# Patient Record
Sex: Female | Born: 1950 | Hispanic: No | Smoking: Former smoker
Health system: Southern US, Community
[De-identification: ages and names within clinical notes are randomized; demographics above are authoritative.]

## PROBLEM LIST (undated history)

## (undated) DIAGNOSIS — K219 Gastro-esophageal reflux disease without esophagitis: Secondary | ICD-10-CM

## (undated) DIAGNOSIS — I8393 Asymptomatic varicose veins of bilateral lower extremities: Secondary | ICD-10-CM

## (undated) DIAGNOSIS — J301 Allergic rhinitis due to pollen: Secondary | ICD-10-CM

## (undated) DIAGNOSIS — E785 Hyperlipidemia, unspecified: Secondary | ICD-10-CM

## (undated) DIAGNOSIS — E538 Deficiency of other specified B group vitamins: Secondary | ICD-10-CM

## (undated) DIAGNOSIS — E119 Type 2 diabetes mellitus without complications: Secondary | ICD-10-CM

## (undated) DIAGNOSIS — R200 Anesthesia of skin: Secondary | ICD-10-CM

## (undated) DIAGNOSIS — I8392 Asymptomatic varicose veins of left lower extremity: Secondary | ICD-10-CM

## (undated) DIAGNOSIS — L97919 Non-pressure chronic ulcer of unspecified part of right lower leg with unspecified severity: Secondary | ICD-10-CM

## (undated) DIAGNOSIS — I10 Essential (primary) hypertension: Secondary | ICD-10-CM

## (undated) DIAGNOSIS — R202 Paresthesia of skin: Secondary | ICD-10-CM

## (undated) DIAGNOSIS — L97929 Non-pressure chronic ulcer of unspecified part of left lower leg with unspecified severity: Secondary | ICD-10-CM

## (undated) DIAGNOSIS — G629 Polyneuropathy, unspecified: Secondary | ICD-10-CM

## (undated) DIAGNOSIS — E669 Obesity, unspecified: Secondary | ICD-10-CM

## (undated) DIAGNOSIS — I83029 Varicose veins of left lower extremity with ulcer of unspecified site: Secondary | ICD-10-CM

## (undated) DIAGNOSIS — L97909 Non-pressure chronic ulcer of unspecified part of unspecified lower leg with unspecified severity: Secondary | ICD-10-CM

## (undated) HISTORY — DX: Varicose veins of left lower extremity with ulcer of unspecified site: I83.029

## (undated) HISTORY — DX: Deficiency of other specified B group vitamins: E53.8

## (undated) HISTORY — DX: Hyperlipidemia, unspecified: E78.5

## (undated) HISTORY — DX: Non-pressure chronic ulcer of unspecified part of right lower leg with unspecified severity: L97.919

## (undated) HISTORY — DX: Polyneuropathy, unspecified: G62.9

## (undated) HISTORY — DX: Obesity, unspecified: E66.9

## (undated) HISTORY — PX: TUBAL LIGATION: SHX77

## (undated) HISTORY — PX: KNEE SURGERY: SHX244

## (undated) HISTORY — DX: Type 2 diabetes mellitus without complications: E11.9

## (undated) HISTORY — DX: Anesthesia of skin: R20.0

## (undated) HISTORY — PX: CHOLECYSTECTOMY: SHX55

## (undated) HISTORY — DX: Non-pressure chronic ulcer of unspecified part of right lower leg with unspecified severity: L97.929

## (undated) HISTORY — DX: Gastro-esophageal reflux disease without esophagitis: K21.9

## (undated) HISTORY — DX: Allergic rhinitis due to pollen: J30.1

## (undated) HISTORY — DX: Essential (primary) hypertension: I10

## (undated) HISTORY — DX: Asymptomatic varicose veins of bilateral lower extremities: I83.93

## (undated) HISTORY — DX: Anesthesia of skin: R20.2

## (undated) HISTORY — DX: Non-pressure chronic ulcer of unspecified part of unspecified lower leg with unspecified severity: L97.909

## (undated) HISTORY — DX: Asymptomatic varicose veins of left lower extremity: I83.92

---

## 2015-01-19 HISTORY — PX: CATARACT EXTRACTION: SUR2

## 2017-06-10 DIAGNOSIS — E1151 Type 2 diabetes mellitus with diabetic peripheral angiopathy without gangrene: Secondary | ICD-10-CM | POA: Diagnosis not present

## 2017-06-10 DIAGNOSIS — E1140 Type 2 diabetes mellitus with diabetic neuropathy, unspecified: Secondary | ICD-10-CM | POA: Diagnosis not present

## 2017-07-26 DIAGNOSIS — E1151 Type 2 diabetes mellitus with diabetic peripheral angiopathy without gangrene: Secondary | ICD-10-CM | POA: Diagnosis not present

## 2017-07-26 DIAGNOSIS — L89892 Pressure ulcer of other site, stage 2: Secondary | ICD-10-CM | POA: Diagnosis not present

## 2017-07-26 DIAGNOSIS — E1140 Type 2 diabetes mellitus with diabetic neuropathy, unspecified: Secondary | ICD-10-CM | POA: Diagnosis not present

## 2017-08-30 DIAGNOSIS — E1151 Type 2 diabetes mellitus with diabetic peripheral angiopathy without gangrene: Secondary | ICD-10-CM | POA: Diagnosis not present

## 2017-08-30 DIAGNOSIS — E1140 Type 2 diabetes mellitus with diabetic neuropathy, unspecified: Secondary | ICD-10-CM | POA: Diagnosis not present

## 2017-10-19 DIAGNOSIS — Z6841 Body Mass Index (BMI) 40.0 and over, adult: Secondary | ICD-10-CM | POA: Diagnosis not present

## 2017-10-19 DIAGNOSIS — E782 Mixed hyperlipidemia: Secondary | ICD-10-CM | POA: Diagnosis not present

## 2017-10-19 DIAGNOSIS — I8392 Asymptomatic varicose veins of left lower extremity: Secondary | ICD-10-CM | POA: Diagnosis not present

## 2017-10-19 DIAGNOSIS — I10 Essential (primary) hypertension: Secondary | ICD-10-CM | POA: Diagnosis not present

## 2017-10-19 DIAGNOSIS — E538 Deficiency of other specified B group vitamins: Secondary | ICD-10-CM | POA: Diagnosis not present

## 2017-10-19 DIAGNOSIS — E1142 Type 2 diabetes mellitus with diabetic polyneuropathy: Secondary | ICD-10-CM | POA: Diagnosis not present

## 2017-10-19 DIAGNOSIS — K219 Gastro-esophageal reflux disease without esophagitis: Secondary | ICD-10-CM | POA: Diagnosis not present

## 2017-10-27 DIAGNOSIS — Z6841 Body Mass Index (BMI) 40.0 and over, adult: Secondary | ICD-10-CM | POA: Diagnosis not present

## 2017-10-27 DIAGNOSIS — L03032 Cellulitis of left toe: Secondary | ICD-10-CM | POA: Diagnosis not present

## 2017-10-27 DIAGNOSIS — E1142 Type 2 diabetes mellitus with diabetic polyneuropathy: Secondary | ICD-10-CM | POA: Diagnosis not present

## 2017-10-27 DIAGNOSIS — E1165 Type 2 diabetes mellitus with hyperglycemia: Secondary | ICD-10-CM | POA: Diagnosis not present

## 2017-10-31 DIAGNOSIS — Z1231 Encounter for screening mammogram for malignant neoplasm of breast: Secondary | ICD-10-CM | POA: Diagnosis not present

## 2017-11-08 DIAGNOSIS — E1140 Type 2 diabetes mellitus with diabetic neuropathy, unspecified: Secondary | ICD-10-CM | POA: Diagnosis not present

## 2017-11-08 DIAGNOSIS — E1151 Type 2 diabetes mellitus with diabetic peripheral angiopathy without gangrene: Secondary | ICD-10-CM | POA: Diagnosis not present

## 2018-01-31 DIAGNOSIS — E1140 Type 2 diabetes mellitus with diabetic neuropathy, unspecified: Secondary | ICD-10-CM | POA: Diagnosis not present

## 2018-01-31 DIAGNOSIS — E1151 Type 2 diabetes mellitus with diabetic peripheral angiopathy without gangrene: Secondary | ICD-10-CM | POA: Diagnosis not present

## 2018-02-04 DIAGNOSIS — K047 Periapical abscess without sinus: Secondary | ICD-10-CM | POA: Diagnosis not present

## 2018-02-20 DIAGNOSIS — K210 Gastro-esophageal reflux disease with esophagitis: Secondary | ICD-10-CM | POA: Diagnosis not present

## 2018-02-20 DIAGNOSIS — I10 Essential (primary) hypertension: Secondary | ICD-10-CM | POA: Diagnosis not present

## 2018-02-20 DIAGNOSIS — E782 Mixed hyperlipidemia: Secondary | ICD-10-CM | POA: Diagnosis not present

## 2018-02-20 DIAGNOSIS — E1165 Type 2 diabetes mellitus with hyperglycemia: Secondary | ICD-10-CM | POA: Diagnosis not present

## 2018-02-20 DIAGNOSIS — E1142 Type 2 diabetes mellitus with diabetic polyneuropathy: Secondary | ICD-10-CM | POA: Diagnosis not present

## 2018-02-24 DIAGNOSIS — I8392 Asymptomatic varicose veins of left lower extremity: Secondary | ICD-10-CM | POA: Diagnosis not present

## 2018-02-24 DIAGNOSIS — E782 Mixed hyperlipidemia: Secondary | ICD-10-CM | POA: Diagnosis not present

## 2018-02-24 DIAGNOSIS — I10 Essential (primary) hypertension: Secondary | ICD-10-CM | POA: Diagnosis not present

## 2018-02-24 DIAGNOSIS — E1142 Type 2 diabetes mellitus with diabetic polyneuropathy: Secondary | ICD-10-CM | POA: Diagnosis not present

## 2018-02-24 DIAGNOSIS — K219 Gastro-esophageal reflux disease without esophagitis: Secondary | ICD-10-CM | POA: Diagnosis not present

## 2018-04-11 DIAGNOSIS — E1151 Type 2 diabetes mellitus with diabetic peripheral angiopathy without gangrene: Secondary | ICD-10-CM | POA: Diagnosis not present

## 2018-04-11 DIAGNOSIS — E1140 Type 2 diabetes mellitus with diabetic neuropathy, unspecified: Secondary | ICD-10-CM | POA: Diagnosis not present

## 2018-06-19 DIAGNOSIS — E1165 Type 2 diabetes mellitus with hyperglycemia: Secondary | ICD-10-CM | POA: Diagnosis not present

## 2018-06-19 DIAGNOSIS — E1143 Type 2 diabetes mellitus with diabetic autonomic (poly)neuropathy: Secondary | ICD-10-CM | POA: Diagnosis not present

## 2018-06-19 DIAGNOSIS — E1142 Type 2 diabetes mellitus with diabetic polyneuropathy: Secondary | ICD-10-CM | POA: Diagnosis not present

## 2018-06-19 DIAGNOSIS — E782 Mixed hyperlipidemia: Secondary | ICD-10-CM | POA: Diagnosis not present

## 2018-06-19 DIAGNOSIS — E039 Hypothyroidism, unspecified: Secondary | ICD-10-CM | POA: Diagnosis not present

## 2018-06-22 DIAGNOSIS — E782 Mixed hyperlipidemia: Secondary | ICD-10-CM | POA: Diagnosis not present

## 2018-06-22 DIAGNOSIS — I8392 Asymptomatic varicose veins of left lower extremity: Secondary | ICD-10-CM | POA: Diagnosis not present

## 2018-06-22 DIAGNOSIS — Z0001 Encounter for general adult medical examination with abnormal findings: Secondary | ICD-10-CM | POA: Diagnosis not present

## 2018-06-22 DIAGNOSIS — E1142 Type 2 diabetes mellitus with diabetic polyneuropathy: Secondary | ICD-10-CM | POA: Diagnosis not present

## 2018-06-22 DIAGNOSIS — J301 Allergic rhinitis due to pollen: Secondary | ICD-10-CM | POA: Diagnosis not present

## 2018-06-22 DIAGNOSIS — I10 Essential (primary) hypertension: Secondary | ICD-10-CM | POA: Diagnosis not present

## 2018-07-04 DIAGNOSIS — E1140 Type 2 diabetes mellitus with diabetic neuropathy, unspecified: Secondary | ICD-10-CM | POA: Diagnosis not present

## 2018-07-04 DIAGNOSIS — E1151 Type 2 diabetes mellitus with diabetic peripheral angiopathy without gangrene: Secondary | ICD-10-CM | POA: Diagnosis not present

## 2018-08-24 DIAGNOSIS — M810 Age-related osteoporosis without current pathological fracture: Secondary | ICD-10-CM | POA: Diagnosis not present

## 2018-08-24 DIAGNOSIS — M85851 Other specified disorders of bone density and structure, right thigh: Secondary | ICD-10-CM | POA: Diagnosis not present

## 2018-08-24 DIAGNOSIS — M8588 Other specified disorders of bone density and structure, other site: Secondary | ICD-10-CM | POA: Diagnosis not present

## 2018-10-03 DIAGNOSIS — E1151 Type 2 diabetes mellitus with diabetic peripheral angiopathy without gangrene: Secondary | ICD-10-CM | POA: Diagnosis not present

## 2018-10-03 DIAGNOSIS — E1140 Type 2 diabetes mellitus with diabetic neuropathy, unspecified: Secondary | ICD-10-CM | POA: Diagnosis not present

## 2018-10-23 DIAGNOSIS — E1142 Type 2 diabetes mellitus with diabetic polyneuropathy: Secondary | ICD-10-CM | POA: Diagnosis not present

## 2018-10-23 DIAGNOSIS — E782 Mixed hyperlipidemia: Secondary | ICD-10-CM | POA: Diagnosis not present

## 2018-10-23 DIAGNOSIS — E039 Hypothyroidism, unspecified: Secondary | ICD-10-CM | POA: Diagnosis not present

## 2018-10-23 DIAGNOSIS — E1165 Type 2 diabetes mellitus with hyperglycemia: Secondary | ICD-10-CM | POA: Diagnosis not present

## 2018-10-23 DIAGNOSIS — Z9189 Other specified personal risk factors, not elsewhere classified: Secondary | ICD-10-CM | POA: Diagnosis not present

## 2018-10-23 DIAGNOSIS — E1143 Type 2 diabetes mellitus with diabetic autonomic (poly)neuropathy: Secondary | ICD-10-CM | POA: Diagnosis not present

## 2018-10-26 DIAGNOSIS — Z23 Encounter for immunization: Secondary | ICD-10-CM | POA: Diagnosis not present

## 2018-10-26 DIAGNOSIS — K219 Gastro-esophageal reflux disease without esophagitis: Secondary | ICD-10-CM | POA: Diagnosis not present

## 2018-10-26 DIAGNOSIS — E1142 Type 2 diabetes mellitus with diabetic polyneuropathy: Secondary | ICD-10-CM | POA: Diagnosis not present

## 2018-10-26 DIAGNOSIS — I8392 Asymptomatic varicose veins of left lower extremity: Secondary | ICD-10-CM | POA: Diagnosis not present

## 2018-10-26 DIAGNOSIS — E782 Mixed hyperlipidemia: Secondary | ICD-10-CM | POA: Diagnosis not present

## 2018-10-26 DIAGNOSIS — I10 Essential (primary) hypertension: Secondary | ICD-10-CM | POA: Diagnosis not present

## 2019-01-02 DIAGNOSIS — E1140 Type 2 diabetes mellitus with diabetic neuropathy, unspecified: Secondary | ICD-10-CM | POA: Diagnosis not present

## 2019-01-02 DIAGNOSIS — E1151 Type 2 diabetes mellitus with diabetic peripheral angiopathy without gangrene: Secondary | ICD-10-CM | POA: Diagnosis not present

## 2019-02-05 DIAGNOSIS — J329 Chronic sinusitis, unspecified: Secondary | ICD-10-CM | POA: Diagnosis not present

## 2019-02-20 DIAGNOSIS — H524 Presbyopia: Secondary | ICD-10-CM | POA: Diagnosis not present

## 2019-02-20 DIAGNOSIS — H26493 Other secondary cataract, bilateral: Secondary | ICD-10-CM | POA: Diagnosis not present

## 2019-02-20 DIAGNOSIS — E119 Type 2 diabetes mellitus without complications: Secondary | ICD-10-CM | POA: Diagnosis not present

## 2019-02-22 DIAGNOSIS — K2100 Gastro-esophageal reflux disease with esophagitis, without bleeding: Secondary | ICD-10-CM | POA: Diagnosis not present

## 2019-02-22 DIAGNOSIS — N183 Chronic kidney disease, stage 3 unspecified: Secondary | ICD-10-CM | POA: Diagnosis not present

## 2019-02-22 DIAGNOSIS — R5383 Other fatigue: Secondary | ICD-10-CM | POA: Diagnosis not present

## 2019-02-22 DIAGNOSIS — E1165 Type 2 diabetes mellitus with hyperglycemia: Secondary | ICD-10-CM | POA: Diagnosis not present

## 2019-02-22 DIAGNOSIS — E782 Mixed hyperlipidemia: Secondary | ICD-10-CM | POA: Diagnosis not present

## 2019-02-22 DIAGNOSIS — I10 Essential (primary) hypertension: Secondary | ICD-10-CM | POA: Diagnosis not present

## 2019-02-28 DIAGNOSIS — I10 Essential (primary) hypertension: Secondary | ICD-10-CM | POA: Diagnosis not present

## 2019-02-28 DIAGNOSIS — K219 Gastro-esophageal reflux disease without esophagitis: Secondary | ICD-10-CM | POA: Diagnosis not present

## 2019-02-28 DIAGNOSIS — I8392 Asymptomatic varicose veins of left lower extremity: Secondary | ICD-10-CM | POA: Diagnosis not present

## 2019-02-28 DIAGNOSIS — E782 Mixed hyperlipidemia: Secondary | ICD-10-CM | POA: Diagnosis not present

## 2019-02-28 DIAGNOSIS — E1142 Type 2 diabetes mellitus with diabetic polyneuropathy: Secondary | ICD-10-CM | POA: Diagnosis not present

## 2019-04-03 DIAGNOSIS — E1151 Type 2 diabetes mellitus with diabetic peripheral angiopathy without gangrene: Secondary | ICD-10-CM | POA: Diagnosis not present

## 2019-04-03 DIAGNOSIS — E1140 Type 2 diabetes mellitus with diabetic neuropathy, unspecified: Secondary | ICD-10-CM | POA: Diagnosis not present

## 2019-06-26 DIAGNOSIS — E1140 Type 2 diabetes mellitus with diabetic neuropathy, unspecified: Secondary | ICD-10-CM | POA: Diagnosis not present

## 2019-06-26 DIAGNOSIS — E1151 Type 2 diabetes mellitus with diabetic peripheral angiopathy without gangrene: Secondary | ICD-10-CM | POA: Diagnosis not present

## 2019-07-09 DIAGNOSIS — J329 Chronic sinusitis, unspecified: Secondary | ICD-10-CM | POA: Diagnosis not present

## 2019-07-13 DIAGNOSIS — E039 Hypothyroidism, unspecified: Secondary | ICD-10-CM | POA: Diagnosis not present

## 2019-07-13 DIAGNOSIS — E1143 Type 2 diabetes mellitus with diabetic autonomic (poly)neuropathy: Secondary | ICD-10-CM | POA: Diagnosis not present

## 2019-07-13 DIAGNOSIS — Z0001 Encounter for general adult medical examination with abnormal findings: Secondary | ICD-10-CM | POA: Diagnosis not present

## 2019-07-13 DIAGNOSIS — D529 Folate deficiency anemia, unspecified: Secondary | ICD-10-CM | POA: Diagnosis not present

## 2019-07-13 DIAGNOSIS — I10 Essential (primary) hypertension: Secondary | ICD-10-CM | POA: Diagnosis not present

## 2019-07-13 DIAGNOSIS — K2100 Gastro-esophageal reflux disease with esophagitis, without bleeding: Secondary | ICD-10-CM | POA: Diagnosis not present

## 2019-07-13 DIAGNOSIS — D510 Vitamin B12 deficiency anemia due to intrinsic factor deficiency: Secondary | ICD-10-CM | POA: Diagnosis not present

## 2019-07-13 DIAGNOSIS — E782 Mixed hyperlipidemia: Secondary | ICD-10-CM | POA: Diagnosis not present

## 2019-07-13 DIAGNOSIS — D649 Anemia, unspecified: Secondary | ICD-10-CM | POA: Diagnosis not present

## 2019-07-16 DIAGNOSIS — Z0001 Encounter for general adult medical examination with abnormal findings: Secondary | ICD-10-CM | POA: Diagnosis not present

## 2019-07-16 DIAGNOSIS — Z1212 Encounter for screening for malignant neoplasm of rectum: Secondary | ICD-10-CM | POA: Diagnosis not present

## 2019-07-16 DIAGNOSIS — I10 Essential (primary) hypertension: Secondary | ICD-10-CM | POA: Diagnosis not present

## 2019-07-16 DIAGNOSIS — E1142 Type 2 diabetes mellitus with diabetic polyneuropathy: Secondary | ICD-10-CM | POA: Diagnosis not present

## 2019-07-16 DIAGNOSIS — E782 Mixed hyperlipidemia: Secondary | ICD-10-CM | POA: Diagnosis not present

## 2019-07-16 DIAGNOSIS — R1030 Lower abdominal pain, unspecified: Secondary | ICD-10-CM | POA: Diagnosis not present

## 2019-07-16 DIAGNOSIS — R300 Dysuria: Secondary | ICD-10-CM | POA: Diagnosis not present

## 2019-07-16 DIAGNOSIS — R1012 Left upper quadrant pain: Secondary | ICD-10-CM | POA: Diagnosis not present

## 2019-07-16 DIAGNOSIS — E538 Deficiency of other specified B group vitamins: Secondary | ICD-10-CM | POA: Diagnosis not present

## 2019-07-16 DIAGNOSIS — I8392 Asymptomatic varicose veins of left lower extremity: Secondary | ICD-10-CM | POA: Diagnosis not present

## 2019-07-16 DIAGNOSIS — J301 Allergic rhinitis due to pollen: Secondary | ICD-10-CM | POA: Diagnosis not present

## 2019-08-21 DIAGNOSIS — H524 Presbyopia: Secondary | ICD-10-CM | POA: Diagnosis not present

## 2019-08-21 DIAGNOSIS — H5202 Hypermetropia, left eye: Secondary | ICD-10-CM | POA: Diagnosis not present

## 2019-08-26 DIAGNOSIS — L97821 Non-pressure chronic ulcer of other part of left lower leg limited to breakdown of skin: Secondary | ICD-10-CM | POA: Diagnosis not present

## 2019-08-26 DIAGNOSIS — I482 Chronic atrial fibrillation, unspecified: Secondary | ICD-10-CM | POA: Diagnosis not present

## 2019-08-26 DIAGNOSIS — N183 Chronic kidney disease, stage 3 unspecified: Secondary | ICD-10-CM | POA: Diagnosis not present

## 2019-08-26 DIAGNOSIS — L97811 Non-pressure chronic ulcer of other part of right lower leg limited to breakdown of skin: Secondary | ICD-10-CM | POA: Diagnosis not present

## 2019-08-26 DIAGNOSIS — I129 Hypertensive chronic kidney disease with stage 1 through stage 4 chronic kidney disease, or unspecified chronic kidney disease: Secondary | ICD-10-CM | POA: Diagnosis not present

## 2019-08-26 DIAGNOSIS — E1142 Type 2 diabetes mellitus with diabetic polyneuropathy: Secondary | ICD-10-CM | POA: Diagnosis not present

## 2019-08-26 DIAGNOSIS — Z4800 Encounter for change or removal of nonsurgical wound dressing: Secondary | ICD-10-CM | POA: Diagnosis not present

## 2019-08-26 DIAGNOSIS — Z7984 Long term (current) use of oral hypoglycemic drugs: Secondary | ICD-10-CM | POA: Diagnosis not present

## 2019-08-26 DIAGNOSIS — I87313 Chronic venous hypertension (idiopathic) with ulcer of bilateral lower extremity: Secondary | ICD-10-CM | POA: Diagnosis not present

## 2019-08-26 DIAGNOSIS — E1151 Type 2 diabetes mellitus with diabetic peripheral angiopathy without gangrene: Secondary | ICD-10-CM | POA: Diagnosis not present

## 2019-08-26 DIAGNOSIS — E1122 Type 2 diabetes mellitus with diabetic chronic kidney disease: Secondary | ICD-10-CM | POA: Diagnosis not present

## 2019-08-29 DIAGNOSIS — E1142 Type 2 diabetes mellitus with diabetic polyneuropathy: Secondary | ICD-10-CM | POA: Diagnosis not present

## 2019-08-29 DIAGNOSIS — L97811 Non-pressure chronic ulcer of other part of right lower leg limited to breakdown of skin: Secondary | ICD-10-CM | POA: Diagnosis not present

## 2019-08-29 DIAGNOSIS — I87313 Chronic venous hypertension (idiopathic) with ulcer of bilateral lower extremity: Secondary | ICD-10-CM | POA: Diagnosis not present

## 2019-08-29 DIAGNOSIS — I129 Hypertensive chronic kidney disease with stage 1 through stage 4 chronic kidney disease, or unspecified chronic kidney disease: Secondary | ICD-10-CM | POA: Diagnosis not present

## 2019-08-29 DIAGNOSIS — E1151 Type 2 diabetes mellitus with diabetic peripheral angiopathy without gangrene: Secondary | ICD-10-CM | POA: Diagnosis not present

## 2019-08-29 DIAGNOSIS — E1122 Type 2 diabetes mellitus with diabetic chronic kidney disease: Secondary | ICD-10-CM | POA: Diagnosis not present

## 2019-08-29 DIAGNOSIS — L97821 Non-pressure chronic ulcer of other part of left lower leg limited to breakdown of skin: Secondary | ICD-10-CM | POA: Diagnosis not present

## 2019-08-29 DIAGNOSIS — I482 Chronic atrial fibrillation, unspecified: Secondary | ICD-10-CM | POA: Diagnosis not present

## 2019-08-29 DIAGNOSIS — Z7984 Long term (current) use of oral hypoglycemic drugs: Secondary | ICD-10-CM | POA: Diagnosis not present

## 2019-08-29 DIAGNOSIS — Z4800 Encounter for change or removal of nonsurgical wound dressing: Secondary | ICD-10-CM | POA: Diagnosis not present

## 2019-08-29 DIAGNOSIS — N183 Chronic kidney disease, stage 3 unspecified: Secondary | ICD-10-CM | POA: Diagnosis not present

## 2019-08-31 DIAGNOSIS — L97811 Non-pressure chronic ulcer of other part of right lower leg limited to breakdown of skin: Secondary | ICD-10-CM | POA: Diagnosis not present

## 2019-08-31 DIAGNOSIS — E1142 Type 2 diabetes mellitus with diabetic polyneuropathy: Secondary | ICD-10-CM | POA: Diagnosis not present

## 2019-08-31 DIAGNOSIS — N183 Chronic kidney disease, stage 3 unspecified: Secondary | ICD-10-CM | POA: Diagnosis not present

## 2019-08-31 DIAGNOSIS — Z7984 Long term (current) use of oral hypoglycemic drugs: Secondary | ICD-10-CM | POA: Diagnosis not present

## 2019-08-31 DIAGNOSIS — I87313 Chronic venous hypertension (idiopathic) with ulcer of bilateral lower extremity: Secondary | ICD-10-CM | POA: Diagnosis not present

## 2019-08-31 DIAGNOSIS — Z4800 Encounter for change or removal of nonsurgical wound dressing: Secondary | ICD-10-CM | POA: Diagnosis not present

## 2019-08-31 DIAGNOSIS — I129 Hypertensive chronic kidney disease with stage 1 through stage 4 chronic kidney disease, or unspecified chronic kidney disease: Secondary | ICD-10-CM | POA: Diagnosis not present

## 2019-08-31 DIAGNOSIS — I482 Chronic atrial fibrillation, unspecified: Secondary | ICD-10-CM | POA: Diagnosis not present

## 2019-08-31 DIAGNOSIS — E1122 Type 2 diabetes mellitus with diabetic chronic kidney disease: Secondary | ICD-10-CM | POA: Diagnosis not present

## 2019-08-31 DIAGNOSIS — L97821 Non-pressure chronic ulcer of other part of left lower leg limited to breakdown of skin: Secondary | ICD-10-CM | POA: Diagnosis not present

## 2019-08-31 DIAGNOSIS — E1151 Type 2 diabetes mellitus with diabetic peripheral angiopathy without gangrene: Secondary | ICD-10-CM | POA: Diagnosis not present

## 2019-09-03 DIAGNOSIS — I482 Chronic atrial fibrillation, unspecified: Secondary | ICD-10-CM | POA: Diagnosis not present

## 2019-09-03 DIAGNOSIS — E1151 Type 2 diabetes mellitus with diabetic peripheral angiopathy without gangrene: Secondary | ICD-10-CM | POA: Diagnosis not present

## 2019-09-03 DIAGNOSIS — L97811 Non-pressure chronic ulcer of other part of right lower leg limited to breakdown of skin: Secondary | ICD-10-CM | POA: Diagnosis not present

## 2019-09-03 DIAGNOSIS — Z7984 Long term (current) use of oral hypoglycemic drugs: Secondary | ICD-10-CM | POA: Diagnosis not present

## 2019-09-03 DIAGNOSIS — N183 Chronic kidney disease, stage 3 unspecified: Secondary | ICD-10-CM | POA: Diagnosis not present

## 2019-09-03 DIAGNOSIS — Z4800 Encounter for change or removal of nonsurgical wound dressing: Secondary | ICD-10-CM | POA: Diagnosis not present

## 2019-09-03 DIAGNOSIS — I129 Hypertensive chronic kidney disease with stage 1 through stage 4 chronic kidney disease, or unspecified chronic kidney disease: Secondary | ICD-10-CM | POA: Diagnosis not present

## 2019-09-03 DIAGNOSIS — L97821 Non-pressure chronic ulcer of other part of left lower leg limited to breakdown of skin: Secondary | ICD-10-CM | POA: Diagnosis not present

## 2019-09-03 DIAGNOSIS — I87313 Chronic venous hypertension (idiopathic) with ulcer of bilateral lower extremity: Secondary | ICD-10-CM | POA: Diagnosis not present

## 2019-09-03 DIAGNOSIS — E1122 Type 2 diabetes mellitus with diabetic chronic kidney disease: Secondary | ICD-10-CM | POA: Diagnosis not present

## 2019-09-03 DIAGNOSIS — E1142 Type 2 diabetes mellitus with diabetic polyneuropathy: Secondary | ICD-10-CM | POA: Diagnosis not present

## 2019-09-04 DIAGNOSIS — E1140 Type 2 diabetes mellitus with diabetic neuropathy, unspecified: Secondary | ICD-10-CM | POA: Diagnosis not present

## 2019-09-04 DIAGNOSIS — E1151 Type 2 diabetes mellitus with diabetic peripheral angiopathy without gangrene: Secondary | ICD-10-CM | POA: Diagnosis not present

## 2019-09-06 DIAGNOSIS — N183 Chronic kidney disease, stage 3 unspecified: Secondary | ICD-10-CM | POA: Diagnosis not present

## 2019-09-06 DIAGNOSIS — E1151 Type 2 diabetes mellitus with diabetic peripheral angiopathy without gangrene: Secondary | ICD-10-CM | POA: Diagnosis not present

## 2019-09-06 DIAGNOSIS — L97821 Non-pressure chronic ulcer of other part of left lower leg limited to breakdown of skin: Secondary | ICD-10-CM | POA: Diagnosis not present

## 2019-09-06 DIAGNOSIS — Z7984 Long term (current) use of oral hypoglycemic drugs: Secondary | ICD-10-CM | POA: Diagnosis not present

## 2019-09-06 DIAGNOSIS — E1122 Type 2 diabetes mellitus with diabetic chronic kidney disease: Secondary | ICD-10-CM | POA: Diagnosis not present

## 2019-09-06 DIAGNOSIS — I129 Hypertensive chronic kidney disease with stage 1 through stage 4 chronic kidney disease, or unspecified chronic kidney disease: Secondary | ICD-10-CM | POA: Diagnosis not present

## 2019-09-06 DIAGNOSIS — I87313 Chronic venous hypertension (idiopathic) with ulcer of bilateral lower extremity: Secondary | ICD-10-CM | POA: Diagnosis not present

## 2019-09-06 DIAGNOSIS — E1142 Type 2 diabetes mellitus with diabetic polyneuropathy: Secondary | ICD-10-CM | POA: Diagnosis not present

## 2019-09-06 DIAGNOSIS — Z4800 Encounter for change or removal of nonsurgical wound dressing: Secondary | ICD-10-CM | POA: Diagnosis not present

## 2019-09-06 DIAGNOSIS — L97811 Non-pressure chronic ulcer of other part of right lower leg limited to breakdown of skin: Secondary | ICD-10-CM | POA: Diagnosis not present

## 2019-09-06 DIAGNOSIS — I482 Chronic atrial fibrillation, unspecified: Secondary | ICD-10-CM | POA: Diagnosis not present

## 2019-09-10 DIAGNOSIS — I87313 Chronic venous hypertension (idiopathic) with ulcer of bilateral lower extremity: Secondary | ICD-10-CM | POA: Diagnosis not present

## 2019-09-10 DIAGNOSIS — E1151 Type 2 diabetes mellitus with diabetic peripheral angiopathy without gangrene: Secondary | ICD-10-CM | POA: Diagnosis not present

## 2019-09-10 DIAGNOSIS — E1142 Type 2 diabetes mellitus with diabetic polyneuropathy: Secondary | ICD-10-CM | POA: Diagnosis not present

## 2019-09-10 DIAGNOSIS — N183 Chronic kidney disease, stage 3 unspecified: Secondary | ICD-10-CM | POA: Diagnosis not present

## 2019-09-10 DIAGNOSIS — I482 Chronic atrial fibrillation, unspecified: Secondary | ICD-10-CM | POA: Diagnosis not present

## 2019-09-10 DIAGNOSIS — Z4800 Encounter for change or removal of nonsurgical wound dressing: Secondary | ICD-10-CM | POA: Diagnosis not present

## 2019-09-10 DIAGNOSIS — L97821 Non-pressure chronic ulcer of other part of left lower leg limited to breakdown of skin: Secondary | ICD-10-CM | POA: Diagnosis not present

## 2019-09-10 DIAGNOSIS — Z7984 Long term (current) use of oral hypoglycemic drugs: Secondary | ICD-10-CM | POA: Diagnosis not present

## 2019-09-10 DIAGNOSIS — L97811 Non-pressure chronic ulcer of other part of right lower leg limited to breakdown of skin: Secondary | ICD-10-CM | POA: Diagnosis not present

## 2019-09-10 DIAGNOSIS — I129 Hypertensive chronic kidney disease with stage 1 through stage 4 chronic kidney disease, or unspecified chronic kidney disease: Secondary | ICD-10-CM | POA: Diagnosis not present

## 2019-09-10 DIAGNOSIS — E1122 Type 2 diabetes mellitus with diabetic chronic kidney disease: Secondary | ICD-10-CM | POA: Diagnosis not present

## 2019-09-13 DIAGNOSIS — E1122 Type 2 diabetes mellitus with diabetic chronic kidney disease: Secondary | ICD-10-CM | POA: Diagnosis not present

## 2019-09-13 DIAGNOSIS — E1142 Type 2 diabetes mellitus with diabetic polyneuropathy: Secondary | ICD-10-CM | POA: Diagnosis not present

## 2019-09-13 DIAGNOSIS — E1151 Type 2 diabetes mellitus with diabetic peripheral angiopathy without gangrene: Secondary | ICD-10-CM | POA: Diagnosis not present

## 2019-09-13 DIAGNOSIS — L97811 Non-pressure chronic ulcer of other part of right lower leg limited to breakdown of skin: Secondary | ICD-10-CM | POA: Diagnosis not present

## 2019-09-13 DIAGNOSIS — I87313 Chronic venous hypertension (idiopathic) with ulcer of bilateral lower extremity: Secondary | ICD-10-CM | POA: Diagnosis not present

## 2019-09-13 DIAGNOSIS — Z7984 Long term (current) use of oral hypoglycemic drugs: Secondary | ICD-10-CM | POA: Diagnosis not present

## 2019-09-13 DIAGNOSIS — Z4800 Encounter for change or removal of nonsurgical wound dressing: Secondary | ICD-10-CM | POA: Diagnosis not present

## 2019-09-13 DIAGNOSIS — L97821 Non-pressure chronic ulcer of other part of left lower leg limited to breakdown of skin: Secondary | ICD-10-CM | POA: Diagnosis not present

## 2019-09-13 DIAGNOSIS — N183 Chronic kidney disease, stage 3 unspecified: Secondary | ICD-10-CM | POA: Diagnosis not present

## 2019-09-13 DIAGNOSIS — I129 Hypertensive chronic kidney disease with stage 1 through stage 4 chronic kidney disease, or unspecified chronic kidney disease: Secondary | ICD-10-CM | POA: Diagnosis not present

## 2019-09-13 DIAGNOSIS — I482 Chronic atrial fibrillation, unspecified: Secondary | ICD-10-CM | POA: Diagnosis not present

## 2019-09-17 DIAGNOSIS — L97811 Non-pressure chronic ulcer of other part of right lower leg limited to breakdown of skin: Secondary | ICD-10-CM | POA: Diagnosis not present

## 2019-09-17 DIAGNOSIS — I129 Hypertensive chronic kidney disease with stage 1 through stage 4 chronic kidney disease, or unspecified chronic kidney disease: Secondary | ICD-10-CM | POA: Diagnosis not present

## 2019-09-17 DIAGNOSIS — I482 Chronic atrial fibrillation, unspecified: Secondary | ICD-10-CM | POA: Diagnosis not present

## 2019-09-17 DIAGNOSIS — E1151 Type 2 diabetes mellitus with diabetic peripheral angiopathy without gangrene: Secondary | ICD-10-CM | POA: Diagnosis not present

## 2019-09-17 DIAGNOSIS — L97821 Non-pressure chronic ulcer of other part of left lower leg limited to breakdown of skin: Secondary | ICD-10-CM | POA: Diagnosis not present

## 2019-09-17 DIAGNOSIS — Z7984 Long term (current) use of oral hypoglycemic drugs: Secondary | ICD-10-CM | POA: Diagnosis not present

## 2019-09-17 DIAGNOSIS — E1142 Type 2 diabetes mellitus with diabetic polyneuropathy: Secondary | ICD-10-CM | POA: Diagnosis not present

## 2019-09-17 DIAGNOSIS — E1122 Type 2 diabetes mellitus with diabetic chronic kidney disease: Secondary | ICD-10-CM | POA: Diagnosis not present

## 2019-09-17 DIAGNOSIS — Z4800 Encounter for change or removal of nonsurgical wound dressing: Secondary | ICD-10-CM | POA: Diagnosis not present

## 2019-09-17 DIAGNOSIS — N183 Chronic kidney disease, stage 3 unspecified: Secondary | ICD-10-CM | POA: Diagnosis not present

## 2019-09-17 DIAGNOSIS — I87313 Chronic venous hypertension (idiopathic) with ulcer of bilateral lower extremity: Secondary | ICD-10-CM | POA: Diagnosis not present

## 2019-09-20 DIAGNOSIS — N183 Chronic kidney disease, stage 3 unspecified: Secondary | ICD-10-CM | POA: Diagnosis not present

## 2019-09-20 DIAGNOSIS — Z4800 Encounter for change or removal of nonsurgical wound dressing: Secondary | ICD-10-CM | POA: Diagnosis not present

## 2019-09-20 DIAGNOSIS — L97811 Non-pressure chronic ulcer of other part of right lower leg limited to breakdown of skin: Secondary | ICD-10-CM | POA: Diagnosis not present

## 2019-09-20 DIAGNOSIS — E1142 Type 2 diabetes mellitus with diabetic polyneuropathy: Secondary | ICD-10-CM | POA: Diagnosis not present

## 2019-09-20 DIAGNOSIS — E1151 Type 2 diabetes mellitus with diabetic peripheral angiopathy without gangrene: Secondary | ICD-10-CM | POA: Diagnosis not present

## 2019-09-20 DIAGNOSIS — I129 Hypertensive chronic kidney disease with stage 1 through stage 4 chronic kidney disease, or unspecified chronic kidney disease: Secondary | ICD-10-CM | POA: Diagnosis not present

## 2019-09-20 DIAGNOSIS — I482 Chronic atrial fibrillation, unspecified: Secondary | ICD-10-CM | POA: Diagnosis not present

## 2019-09-20 DIAGNOSIS — L97821 Non-pressure chronic ulcer of other part of left lower leg limited to breakdown of skin: Secondary | ICD-10-CM | POA: Diagnosis not present

## 2019-09-20 DIAGNOSIS — Z7984 Long term (current) use of oral hypoglycemic drugs: Secondary | ICD-10-CM | POA: Diagnosis not present

## 2019-09-20 DIAGNOSIS — E1122 Type 2 diabetes mellitus with diabetic chronic kidney disease: Secondary | ICD-10-CM | POA: Diagnosis not present

## 2019-09-20 DIAGNOSIS — I87313 Chronic venous hypertension (idiopathic) with ulcer of bilateral lower extremity: Secondary | ICD-10-CM | POA: Diagnosis not present

## 2019-09-25 DIAGNOSIS — N183 Chronic kidney disease, stage 3 unspecified: Secondary | ICD-10-CM | POA: Diagnosis not present

## 2019-09-25 DIAGNOSIS — E1142 Type 2 diabetes mellitus with diabetic polyneuropathy: Secondary | ICD-10-CM | POA: Diagnosis not present

## 2019-09-25 DIAGNOSIS — E1151 Type 2 diabetes mellitus with diabetic peripheral angiopathy without gangrene: Secondary | ICD-10-CM | POA: Diagnosis not present

## 2019-09-25 DIAGNOSIS — I129 Hypertensive chronic kidney disease with stage 1 through stage 4 chronic kidney disease, or unspecified chronic kidney disease: Secondary | ICD-10-CM | POA: Diagnosis not present

## 2019-09-25 DIAGNOSIS — E1122 Type 2 diabetes mellitus with diabetic chronic kidney disease: Secondary | ICD-10-CM | POA: Diagnosis not present

## 2019-09-25 DIAGNOSIS — L97811 Non-pressure chronic ulcer of other part of right lower leg limited to breakdown of skin: Secondary | ICD-10-CM | POA: Diagnosis not present

## 2019-09-25 DIAGNOSIS — Z7984 Long term (current) use of oral hypoglycemic drugs: Secondary | ICD-10-CM | POA: Diagnosis not present

## 2019-09-25 DIAGNOSIS — L97821 Non-pressure chronic ulcer of other part of left lower leg limited to breakdown of skin: Secondary | ICD-10-CM | POA: Diagnosis not present

## 2019-09-25 DIAGNOSIS — Z4800 Encounter for change or removal of nonsurgical wound dressing: Secondary | ICD-10-CM | POA: Diagnosis not present

## 2019-09-25 DIAGNOSIS — I482 Chronic atrial fibrillation, unspecified: Secondary | ICD-10-CM | POA: Diagnosis not present

## 2019-09-25 DIAGNOSIS — I87313 Chronic venous hypertension (idiopathic) with ulcer of bilateral lower extremity: Secondary | ICD-10-CM | POA: Diagnosis not present

## 2019-09-27 DIAGNOSIS — I482 Chronic atrial fibrillation, unspecified: Secondary | ICD-10-CM | POA: Diagnosis not present

## 2019-09-27 DIAGNOSIS — Z7984 Long term (current) use of oral hypoglycemic drugs: Secondary | ICD-10-CM | POA: Diagnosis not present

## 2019-09-27 DIAGNOSIS — I129 Hypertensive chronic kidney disease with stage 1 through stage 4 chronic kidney disease, or unspecified chronic kidney disease: Secondary | ICD-10-CM | POA: Diagnosis not present

## 2019-09-27 DIAGNOSIS — E1151 Type 2 diabetes mellitus with diabetic peripheral angiopathy without gangrene: Secondary | ICD-10-CM | POA: Diagnosis not present

## 2019-09-27 DIAGNOSIS — Z4800 Encounter for change or removal of nonsurgical wound dressing: Secondary | ICD-10-CM | POA: Diagnosis not present

## 2019-09-27 DIAGNOSIS — L97811 Non-pressure chronic ulcer of other part of right lower leg limited to breakdown of skin: Secondary | ICD-10-CM | POA: Diagnosis not present

## 2019-09-27 DIAGNOSIS — L97821 Non-pressure chronic ulcer of other part of left lower leg limited to breakdown of skin: Secondary | ICD-10-CM | POA: Diagnosis not present

## 2019-09-27 DIAGNOSIS — E1122 Type 2 diabetes mellitus with diabetic chronic kidney disease: Secondary | ICD-10-CM | POA: Diagnosis not present

## 2019-09-27 DIAGNOSIS — N183 Chronic kidney disease, stage 3 unspecified: Secondary | ICD-10-CM | POA: Diagnosis not present

## 2019-09-27 DIAGNOSIS — I87313 Chronic venous hypertension (idiopathic) with ulcer of bilateral lower extremity: Secondary | ICD-10-CM | POA: Diagnosis not present

## 2019-09-27 DIAGNOSIS — E1142 Type 2 diabetes mellitus with diabetic polyneuropathy: Secondary | ICD-10-CM | POA: Diagnosis not present

## 2019-10-01 DIAGNOSIS — I129 Hypertensive chronic kidney disease with stage 1 through stage 4 chronic kidney disease, or unspecified chronic kidney disease: Secondary | ICD-10-CM | POA: Diagnosis not present

## 2019-10-01 DIAGNOSIS — E1142 Type 2 diabetes mellitus with diabetic polyneuropathy: Secondary | ICD-10-CM | POA: Diagnosis not present

## 2019-10-01 DIAGNOSIS — I87313 Chronic venous hypertension (idiopathic) with ulcer of bilateral lower extremity: Secondary | ICD-10-CM | POA: Diagnosis not present

## 2019-10-01 DIAGNOSIS — N183 Chronic kidney disease, stage 3 unspecified: Secondary | ICD-10-CM | POA: Diagnosis not present

## 2019-10-01 DIAGNOSIS — E1122 Type 2 diabetes mellitus with diabetic chronic kidney disease: Secondary | ICD-10-CM | POA: Diagnosis not present

## 2019-10-01 DIAGNOSIS — L97811 Non-pressure chronic ulcer of other part of right lower leg limited to breakdown of skin: Secondary | ICD-10-CM | POA: Diagnosis not present

## 2019-10-01 DIAGNOSIS — L97821 Non-pressure chronic ulcer of other part of left lower leg limited to breakdown of skin: Secondary | ICD-10-CM | POA: Diagnosis not present

## 2019-10-01 DIAGNOSIS — Z4800 Encounter for change or removal of nonsurgical wound dressing: Secondary | ICD-10-CM | POA: Diagnosis not present

## 2019-10-01 DIAGNOSIS — Z7984 Long term (current) use of oral hypoglycemic drugs: Secondary | ICD-10-CM | POA: Diagnosis not present

## 2019-10-01 DIAGNOSIS — E1151 Type 2 diabetes mellitus with diabetic peripheral angiopathy without gangrene: Secondary | ICD-10-CM | POA: Diagnosis not present

## 2019-10-01 DIAGNOSIS — I482 Chronic atrial fibrillation, unspecified: Secondary | ICD-10-CM | POA: Diagnosis not present

## 2019-10-04 DIAGNOSIS — L97821 Non-pressure chronic ulcer of other part of left lower leg limited to breakdown of skin: Secondary | ICD-10-CM | POA: Diagnosis not present

## 2019-10-04 DIAGNOSIS — E1122 Type 2 diabetes mellitus with diabetic chronic kidney disease: Secondary | ICD-10-CM | POA: Diagnosis not present

## 2019-10-04 DIAGNOSIS — I482 Chronic atrial fibrillation, unspecified: Secondary | ICD-10-CM | POA: Diagnosis not present

## 2019-10-04 DIAGNOSIS — I87313 Chronic venous hypertension (idiopathic) with ulcer of bilateral lower extremity: Secondary | ICD-10-CM | POA: Diagnosis not present

## 2019-10-04 DIAGNOSIS — Z4800 Encounter for change or removal of nonsurgical wound dressing: Secondary | ICD-10-CM | POA: Diagnosis not present

## 2019-10-04 DIAGNOSIS — I129 Hypertensive chronic kidney disease with stage 1 through stage 4 chronic kidney disease, or unspecified chronic kidney disease: Secondary | ICD-10-CM | POA: Diagnosis not present

## 2019-10-04 DIAGNOSIS — Z7984 Long term (current) use of oral hypoglycemic drugs: Secondary | ICD-10-CM | POA: Diagnosis not present

## 2019-10-04 DIAGNOSIS — E1151 Type 2 diabetes mellitus with diabetic peripheral angiopathy without gangrene: Secondary | ICD-10-CM | POA: Diagnosis not present

## 2019-10-04 DIAGNOSIS — E1142 Type 2 diabetes mellitus with diabetic polyneuropathy: Secondary | ICD-10-CM | POA: Diagnosis not present

## 2019-10-04 DIAGNOSIS — L97811 Non-pressure chronic ulcer of other part of right lower leg limited to breakdown of skin: Secondary | ICD-10-CM | POA: Diagnosis not present

## 2019-10-04 DIAGNOSIS — N183 Chronic kidney disease, stage 3 unspecified: Secondary | ICD-10-CM | POA: Diagnosis not present

## 2019-10-05 DIAGNOSIS — Z4800 Encounter for change or removal of nonsurgical wound dressing: Secondary | ICD-10-CM | POA: Diagnosis not present

## 2019-10-05 DIAGNOSIS — I87313 Chronic venous hypertension (idiopathic) with ulcer of bilateral lower extremity: Secondary | ICD-10-CM | POA: Diagnosis not present

## 2019-10-05 DIAGNOSIS — L97821 Non-pressure chronic ulcer of other part of left lower leg limited to breakdown of skin: Secondary | ICD-10-CM | POA: Diagnosis not present

## 2019-10-05 DIAGNOSIS — L97811 Non-pressure chronic ulcer of other part of right lower leg limited to breakdown of skin: Secondary | ICD-10-CM | POA: Diagnosis not present

## 2019-10-05 DIAGNOSIS — E1151 Type 2 diabetes mellitus with diabetic peripheral angiopathy without gangrene: Secondary | ICD-10-CM | POA: Diagnosis not present

## 2019-10-08 DIAGNOSIS — E1151 Type 2 diabetes mellitus with diabetic peripheral angiopathy without gangrene: Secondary | ICD-10-CM | POA: Diagnosis not present

## 2019-10-08 DIAGNOSIS — E1122 Type 2 diabetes mellitus with diabetic chronic kidney disease: Secondary | ICD-10-CM | POA: Diagnosis not present

## 2019-10-08 DIAGNOSIS — I482 Chronic atrial fibrillation, unspecified: Secondary | ICD-10-CM | POA: Diagnosis not present

## 2019-10-08 DIAGNOSIS — E1142 Type 2 diabetes mellitus with diabetic polyneuropathy: Secondary | ICD-10-CM | POA: Diagnosis not present

## 2019-10-08 DIAGNOSIS — N183 Chronic kidney disease, stage 3 unspecified: Secondary | ICD-10-CM | POA: Diagnosis not present

## 2019-10-08 DIAGNOSIS — Z4800 Encounter for change or removal of nonsurgical wound dressing: Secondary | ICD-10-CM | POA: Diagnosis not present

## 2019-10-08 DIAGNOSIS — I87313 Chronic venous hypertension (idiopathic) with ulcer of bilateral lower extremity: Secondary | ICD-10-CM | POA: Diagnosis not present

## 2019-10-08 DIAGNOSIS — L97821 Non-pressure chronic ulcer of other part of left lower leg limited to breakdown of skin: Secondary | ICD-10-CM | POA: Diagnosis not present

## 2019-10-08 DIAGNOSIS — I129 Hypertensive chronic kidney disease with stage 1 through stage 4 chronic kidney disease, or unspecified chronic kidney disease: Secondary | ICD-10-CM | POA: Diagnosis not present

## 2019-10-08 DIAGNOSIS — L97811 Non-pressure chronic ulcer of other part of right lower leg limited to breakdown of skin: Secondary | ICD-10-CM | POA: Diagnosis not present

## 2019-10-08 DIAGNOSIS — Z7984 Long term (current) use of oral hypoglycemic drugs: Secondary | ICD-10-CM | POA: Diagnosis not present

## 2019-10-11 DIAGNOSIS — E1122 Type 2 diabetes mellitus with diabetic chronic kidney disease: Secondary | ICD-10-CM | POA: Diagnosis not present

## 2019-10-11 DIAGNOSIS — I482 Chronic atrial fibrillation, unspecified: Secondary | ICD-10-CM | POA: Diagnosis not present

## 2019-10-11 DIAGNOSIS — Z4800 Encounter for change or removal of nonsurgical wound dressing: Secondary | ICD-10-CM | POA: Diagnosis not present

## 2019-10-11 DIAGNOSIS — N183 Chronic kidney disease, stage 3 unspecified: Secondary | ICD-10-CM | POA: Diagnosis not present

## 2019-10-11 DIAGNOSIS — I129 Hypertensive chronic kidney disease with stage 1 through stage 4 chronic kidney disease, or unspecified chronic kidney disease: Secondary | ICD-10-CM | POA: Diagnosis not present

## 2019-10-11 DIAGNOSIS — I87313 Chronic venous hypertension (idiopathic) with ulcer of bilateral lower extremity: Secondary | ICD-10-CM | POA: Diagnosis not present

## 2019-10-11 DIAGNOSIS — E1151 Type 2 diabetes mellitus with diabetic peripheral angiopathy without gangrene: Secondary | ICD-10-CM | POA: Diagnosis not present

## 2019-10-11 DIAGNOSIS — L97821 Non-pressure chronic ulcer of other part of left lower leg limited to breakdown of skin: Secondary | ICD-10-CM | POA: Diagnosis not present

## 2019-10-11 DIAGNOSIS — Z7984 Long term (current) use of oral hypoglycemic drugs: Secondary | ICD-10-CM | POA: Diagnosis not present

## 2019-10-11 DIAGNOSIS — L97811 Non-pressure chronic ulcer of other part of right lower leg limited to breakdown of skin: Secondary | ICD-10-CM | POA: Diagnosis not present

## 2019-10-11 DIAGNOSIS — E1142 Type 2 diabetes mellitus with diabetic polyneuropathy: Secondary | ICD-10-CM | POA: Diagnosis not present

## 2019-10-15 DIAGNOSIS — L97811 Non-pressure chronic ulcer of other part of right lower leg limited to breakdown of skin: Secondary | ICD-10-CM | POA: Diagnosis not present

## 2019-10-15 DIAGNOSIS — L97821 Non-pressure chronic ulcer of other part of left lower leg limited to breakdown of skin: Secondary | ICD-10-CM | POA: Diagnosis not present

## 2019-10-15 DIAGNOSIS — N183 Chronic kidney disease, stage 3 unspecified: Secondary | ICD-10-CM | POA: Diagnosis not present

## 2019-10-15 DIAGNOSIS — Z4800 Encounter for change or removal of nonsurgical wound dressing: Secondary | ICD-10-CM | POA: Diagnosis not present

## 2019-10-15 DIAGNOSIS — Z7984 Long term (current) use of oral hypoglycemic drugs: Secondary | ICD-10-CM | POA: Diagnosis not present

## 2019-10-15 DIAGNOSIS — I87313 Chronic venous hypertension (idiopathic) with ulcer of bilateral lower extremity: Secondary | ICD-10-CM | POA: Diagnosis not present

## 2019-10-15 DIAGNOSIS — E1122 Type 2 diabetes mellitus with diabetic chronic kidney disease: Secondary | ICD-10-CM | POA: Diagnosis not present

## 2019-10-15 DIAGNOSIS — I482 Chronic atrial fibrillation, unspecified: Secondary | ICD-10-CM | POA: Diagnosis not present

## 2019-10-15 DIAGNOSIS — I129 Hypertensive chronic kidney disease with stage 1 through stage 4 chronic kidney disease, or unspecified chronic kidney disease: Secondary | ICD-10-CM | POA: Diagnosis not present

## 2019-10-15 DIAGNOSIS — E1142 Type 2 diabetes mellitus with diabetic polyneuropathy: Secondary | ICD-10-CM | POA: Diagnosis not present

## 2019-10-15 DIAGNOSIS — E1151 Type 2 diabetes mellitus with diabetic peripheral angiopathy without gangrene: Secondary | ICD-10-CM | POA: Diagnosis not present

## 2019-10-17 DIAGNOSIS — Z23 Encounter for immunization: Secondary | ICD-10-CM | POA: Diagnosis not present

## 2019-10-20 DIAGNOSIS — L97821 Non-pressure chronic ulcer of other part of left lower leg limited to breakdown of skin: Secondary | ICD-10-CM | POA: Diagnosis not present

## 2019-10-20 DIAGNOSIS — I129 Hypertensive chronic kidney disease with stage 1 through stage 4 chronic kidney disease, or unspecified chronic kidney disease: Secondary | ICD-10-CM | POA: Diagnosis not present

## 2019-10-20 DIAGNOSIS — L97811 Non-pressure chronic ulcer of other part of right lower leg limited to breakdown of skin: Secondary | ICD-10-CM | POA: Diagnosis not present

## 2019-10-20 DIAGNOSIS — N183 Chronic kidney disease, stage 3 unspecified: Secondary | ICD-10-CM | POA: Diagnosis not present

## 2019-10-20 DIAGNOSIS — Z4800 Encounter for change or removal of nonsurgical wound dressing: Secondary | ICD-10-CM | POA: Diagnosis not present

## 2019-10-20 DIAGNOSIS — E1151 Type 2 diabetes mellitus with diabetic peripheral angiopathy without gangrene: Secondary | ICD-10-CM | POA: Diagnosis not present

## 2019-10-20 DIAGNOSIS — Z7984 Long term (current) use of oral hypoglycemic drugs: Secondary | ICD-10-CM | POA: Diagnosis not present

## 2019-10-20 DIAGNOSIS — E1122 Type 2 diabetes mellitus with diabetic chronic kidney disease: Secondary | ICD-10-CM | POA: Diagnosis not present

## 2019-10-20 DIAGNOSIS — E1142 Type 2 diabetes mellitus with diabetic polyneuropathy: Secondary | ICD-10-CM | POA: Diagnosis not present

## 2019-10-20 DIAGNOSIS — I482 Chronic atrial fibrillation, unspecified: Secondary | ICD-10-CM | POA: Diagnosis not present

## 2019-10-20 DIAGNOSIS — I87313 Chronic venous hypertension (idiopathic) with ulcer of bilateral lower extremity: Secondary | ICD-10-CM | POA: Diagnosis not present

## 2019-10-25 DIAGNOSIS — E1122 Type 2 diabetes mellitus with diabetic chronic kidney disease: Secondary | ICD-10-CM | POA: Diagnosis not present

## 2019-10-25 DIAGNOSIS — Z7984 Long term (current) use of oral hypoglycemic drugs: Secondary | ICD-10-CM | POA: Diagnosis not present

## 2019-10-25 DIAGNOSIS — I87313 Chronic venous hypertension (idiopathic) with ulcer of bilateral lower extremity: Secondary | ICD-10-CM | POA: Diagnosis not present

## 2019-10-25 DIAGNOSIS — N183 Chronic kidney disease, stage 3 unspecified: Secondary | ICD-10-CM | POA: Diagnosis not present

## 2019-10-25 DIAGNOSIS — L97821 Non-pressure chronic ulcer of other part of left lower leg limited to breakdown of skin: Secondary | ICD-10-CM | POA: Diagnosis not present

## 2019-10-25 DIAGNOSIS — E1142 Type 2 diabetes mellitus with diabetic polyneuropathy: Secondary | ICD-10-CM | POA: Diagnosis not present

## 2019-10-25 DIAGNOSIS — E1151 Type 2 diabetes mellitus with diabetic peripheral angiopathy without gangrene: Secondary | ICD-10-CM | POA: Diagnosis not present

## 2019-10-25 DIAGNOSIS — Z4800 Encounter for change or removal of nonsurgical wound dressing: Secondary | ICD-10-CM | POA: Diagnosis not present

## 2019-10-25 DIAGNOSIS — I482 Chronic atrial fibrillation, unspecified: Secondary | ICD-10-CM | POA: Diagnosis not present

## 2019-10-25 DIAGNOSIS — L97811 Non-pressure chronic ulcer of other part of right lower leg limited to breakdown of skin: Secondary | ICD-10-CM | POA: Diagnosis not present

## 2019-10-25 DIAGNOSIS — I129 Hypertensive chronic kidney disease with stage 1 through stage 4 chronic kidney disease, or unspecified chronic kidney disease: Secondary | ICD-10-CM | POA: Diagnosis not present

## 2019-10-29 DIAGNOSIS — Z4800 Encounter for change or removal of nonsurgical wound dressing: Secondary | ICD-10-CM | POA: Diagnosis not present

## 2019-10-29 DIAGNOSIS — Z7984 Long term (current) use of oral hypoglycemic drugs: Secondary | ICD-10-CM | POA: Diagnosis not present

## 2019-10-29 DIAGNOSIS — L97811 Non-pressure chronic ulcer of other part of right lower leg limited to breakdown of skin: Secondary | ICD-10-CM | POA: Diagnosis not present

## 2019-10-29 DIAGNOSIS — I87313 Chronic venous hypertension (idiopathic) with ulcer of bilateral lower extremity: Secondary | ICD-10-CM | POA: Diagnosis not present

## 2019-10-29 DIAGNOSIS — N183 Chronic kidney disease, stage 3 unspecified: Secondary | ICD-10-CM | POA: Diagnosis not present

## 2019-10-29 DIAGNOSIS — I482 Chronic atrial fibrillation, unspecified: Secondary | ICD-10-CM | POA: Diagnosis not present

## 2019-10-29 DIAGNOSIS — L97821 Non-pressure chronic ulcer of other part of left lower leg limited to breakdown of skin: Secondary | ICD-10-CM | POA: Diagnosis not present

## 2019-10-29 DIAGNOSIS — E1142 Type 2 diabetes mellitus with diabetic polyneuropathy: Secondary | ICD-10-CM | POA: Diagnosis not present

## 2019-10-29 DIAGNOSIS — I129 Hypertensive chronic kidney disease with stage 1 through stage 4 chronic kidney disease, or unspecified chronic kidney disease: Secondary | ICD-10-CM | POA: Diagnosis not present

## 2019-10-29 DIAGNOSIS — E1122 Type 2 diabetes mellitus with diabetic chronic kidney disease: Secondary | ICD-10-CM | POA: Diagnosis not present

## 2019-10-29 DIAGNOSIS — E1151 Type 2 diabetes mellitus with diabetic peripheral angiopathy without gangrene: Secondary | ICD-10-CM | POA: Diagnosis not present

## 2019-11-01 DIAGNOSIS — Z4800 Encounter for change or removal of nonsurgical wound dressing: Secondary | ICD-10-CM | POA: Diagnosis not present

## 2019-11-01 DIAGNOSIS — L97821 Non-pressure chronic ulcer of other part of left lower leg limited to breakdown of skin: Secondary | ICD-10-CM | POA: Diagnosis not present

## 2019-11-01 DIAGNOSIS — I129 Hypertensive chronic kidney disease with stage 1 through stage 4 chronic kidney disease, or unspecified chronic kidney disease: Secondary | ICD-10-CM | POA: Diagnosis not present

## 2019-11-01 DIAGNOSIS — E1142 Type 2 diabetes mellitus with diabetic polyneuropathy: Secondary | ICD-10-CM | POA: Diagnosis not present

## 2019-11-01 DIAGNOSIS — E1122 Type 2 diabetes mellitus with diabetic chronic kidney disease: Secondary | ICD-10-CM | POA: Diagnosis not present

## 2019-11-01 DIAGNOSIS — N183 Chronic kidney disease, stage 3 unspecified: Secondary | ICD-10-CM | POA: Diagnosis not present

## 2019-11-01 DIAGNOSIS — L97811 Non-pressure chronic ulcer of other part of right lower leg limited to breakdown of skin: Secondary | ICD-10-CM | POA: Diagnosis not present

## 2019-11-01 DIAGNOSIS — E1151 Type 2 diabetes mellitus with diabetic peripheral angiopathy without gangrene: Secondary | ICD-10-CM | POA: Diagnosis not present

## 2019-11-01 DIAGNOSIS — Z7984 Long term (current) use of oral hypoglycemic drugs: Secondary | ICD-10-CM | POA: Diagnosis not present

## 2019-11-01 DIAGNOSIS — I87313 Chronic venous hypertension (idiopathic) with ulcer of bilateral lower extremity: Secondary | ICD-10-CM | POA: Diagnosis not present

## 2019-11-01 DIAGNOSIS — I482 Chronic atrial fibrillation, unspecified: Secondary | ICD-10-CM | POA: Diagnosis not present

## 2019-11-05 DIAGNOSIS — L97821 Non-pressure chronic ulcer of other part of left lower leg limited to breakdown of skin: Secondary | ICD-10-CM | POA: Diagnosis not present

## 2019-11-05 DIAGNOSIS — E1122 Type 2 diabetes mellitus with diabetic chronic kidney disease: Secondary | ICD-10-CM | POA: Diagnosis not present

## 2019-11-05 DIAGNOSIS — Z7984 Long term (current) use of oral hypoglycemic drugs: Secondary | ICD-10-CM | POA: Diagnosis not present

## 2019-11-05 DIAGNOSIS — E1151 Type 2 diabetes mellitus with diabetic peripheral angiopathy without gangrene: Secondary | ICD-10-CM | POA: Diagnosis not present

## 2019-11-05 DIAGNOSIS — I87313 Chronic venous hypertension (idiopathic) with ulcer of bilateral lower extremity: Secondary | ICD-10-CM | POA: Diagnosis not present

## 2019-11-05 DIAGNOSIS — I129 Hypertensive chronic kidney disease with stage 1 through stage 4 chronic kidney disease, or unspecified chronic kidney disease: Secondary | ICD-10-CM | POA: Diagnosis not present

## 2019-11-05 DIAGNOSIS — L97811 Non-pressure chronic ulcer of other part of right lower leg limited to breakdown of skin: Secondary | ICD-10-CM | POA: Diagnosis not present

## 2019-11-05 DIAGNOSIS — I482 Chronic atrial fibrillation, unspecified: Secondary | ICD-10-CM | POA: Diagnosis not present

## 2019-11-05 DIAGNOSIS — N183 Chronic kidney disease, stage 3 unspecified: Secondary | ICD-10-CM | POA: Diagnosis not present

## 2019-11-05 DIAGNOSIS — E1142 Type 2 diabetes mellitus with diabetic polyneuropathy: Secondary | ICD-10-CM | POA: Diagnosis not present

## 2019-11-05 DIAGNOSIS — Z4800 Encounter for change or removal of nonsurgical wound dressing: Secondary | ICD-10-CM | POA: Diagnosis not present

## 2019-11-08 DIAGNOSIS — Z7984 Long term (current) use of oral hypoglycemic drugs: Secondary | ICD-10-CM | POA: Diagnosis not present

## 2019-11-08 DIAGNOSIS — I482 Chronic atrial fibrillation, unspecified: Secondary | ICD-10-CM | POA: Diagnosis not present

## 2019-11-08 DIAGNOSIS — L97821 Non-pressure chronic ulcer of other part of left lower leg limited to breakdown of skin: Secondary | ICD-10-CM | POA: Diagnosis not present

## 2019-11-08 DIAGNOSIS — E1151 Type 2 diabetes mellitus with diabetic peripheral angiopathy without gangrene: Secondary | ICD-10-CM | POA: Diagnosis not present

## 2019-11-08 DIAGNOSIS — E1142 Type 2 diabetes mellitus with diabetic polyneuropathy: Secondary | ICD-10-CM | POA: Diagnosis not present

## 2019-11-08 DIAGNOSIS — L97811 Non-pressure chronic ulcer of other part of right lower leg limited to breakdown of skin: Secondary | ICD-10-CM | POA: Diagnosis not present

## 2019-11-08 DIAGNOSIS — Z4800 Encounter for change or removal of nonsurgical wound dressing: Secondary | ICD-10-CM | POA: Diagnosis not present

## 2019-11-08 DIAGNOSIS — I87313 Chronic venous hypertension (idiopathic) with ulcer of bilateral lower extremity: Secondary | ICD-10-CM | POA: Diagnosis not present

## 2019-11-08 DIAGNOSIS — N183 Chronic kidney disease, stage 3 unspecified: Secondary | ICD-10-CM | POA: Diagnosis not present

## 2019-11-08 DIAGNOSIS — E1122 Type 2 diabetes mellitus with diabetic chronic kidney disease: Secondary | ICD-10-CM | POA: Diagnosis not present

## 2019-11-08 DIAGNOSIS — I129 Hypertensive chronic kidney disease with stage 1 through stage 4 chronic kidney disease, or unspecified chronic kidney disease: Secondary | ICD-10-CM | POA: Diagnosis not present

## 2019-11-12 DIAGNOSIS — I129 Hypertensive chronic kidney disease with stage 1 through stage 4 chronic kidney disease, or unspecified chronic kidney disease: Secondary | ICD-10-CM | POA: Diagnosis not present

## 2019-11-12 DIAGNOSIS — K2100 Gastro-esophageal reflux disease with esophagitis, without bleeding: Secondary | ICD-10-CM | POA: Diagnosis not present

## 2019-11-12 DIAGNOSIS — I87313 Chronic venous hypertension (idiopathic) with ulcer of bilateral lower extremity: Secondary | ICD-10-CM | POA: Diagnosis not present

## 2019-11-12 DIAGNOSIS — D510 Vitamin B12 deficiency anemia due to intrinsic factor deficiency: Secondary | ICD-10-CM | POA: Diagnosis not present

## 2019-11-12 DIAGNOSIS — E1143 Type 2 diabetes mellitus with diabetic autonomic (poly)neuropathy: Secondary | ICD-10-CM | POA: Diagnosis not present

## 2019-11-12 DIAGNOSIS — E1122 Type 2 diabetes mellitus with diabetic chronic kidney disease: Secondary | ICD-10-CM | POA: Diagnosis not present

## 2019-11-12 DIAGNOSIS — L97811 Non-pressure chronic ulcer of other part of right lower leg limited to breakdown of skin: Secondary | ICD-10-CM | POA: Diagnosis not present

## 2019-11-12 DIAGNOSIS — I10 Essential (primary) hypertension: Secondary | ICD-10-CM | POA: Diagnosis not present

## 2019-11-12 DIAGNOSIS — E039 Hypothyroidism, unspecified: Secondary | ICD-10-CM | POA: Diagnosis not present

## 2019-11-12 DIAGNOSIS — E1151 Type 2 diabetes mellitus with diabetic peripheral angiopathy without gangrene: Secondary | ICD-10-CM | POA: Diagnosis not present

## 2019-11-12 DIAGNOSIS — E1142 Type 2 diabetes mellitus with diabetic polyneuropathy: Secondary | ICD-10-CM | POA: Diagnosis not present

## 2019-11-12 DIAGNOSIS — D520 Dietary folate deficiency anemia: Secondary | ICD-10-CM | POA: Diagnosis not present

## 2019-11-12 DIAGNOSIS — N183 Chronic kidney disease, stage 3 unspecified: Secondary | ICD-10-CM | POA: Diagnosis not present

## 2019-11-12 DIAGNOSIS — E782 Mixed hyperlipidemia: Secondary | ICD-10-CM | POA: Diagnosis not present

## 2019-11-12 DIAGNOSIS — Z7984 Long term (current) use of oral hypoglycemic drugs: Secondary | ICD-10-CM | POA: Diagnosis not present

## 2019-11-12 DIAGNOSIS — L97821 Non-pressure chronic ulcer of other part of left lower leg limited to breakdown of skin: Secondary | ICD-10-CM | POA: Diagnosis not present

## 2019-11-12 DIAGNOSIS — I482 Chronic atrial fibrillation, unspecified: Secondary | ICD-10-CM | POA: Diagnosis not present

## 2019-11-12 DIAGNOSIS — Z4800 Encounter for change or removal of nonsurgical wound dressing: Secondary | ICD-10-CM | POA: Diagnosis not present

## 2019-11-15 DIAGNOSIS — I482 Chronic atrial fibrillation, unspecified: Secondary | ICD-10-CM | POA: Diagnosis not present

## 2019-11-15 DIAGNOSIS — I10 Essential (primary) hypertension: Secondary | ICD-10-CM | POA: Diagnosis not present

## 2019-11-15 DIAGNOSIS — E782 Mixed hyperlipidemia: Secondary | ICD-10-CM | POA: Diagnosis not present

## 2019-11-15 DIAGNOSIS — Z7984 Long term (current) use of oral hypoglycemic drugs: Secondary | ICD-10-CM | POA: Diagnosis not present

## 2019-11-15 DIAGNOSIS — I129 Hypertensive chronic kidney disease with stage 1 through stage 4 chronic kidney disease, or unspecified chronic kidney disease: Secondary | ICD-10-CM | POA: Diagnosis not present

## 2019-11-15 DIAGNOSIS — Z4800 Encounter for change or removal of nonsurgical wound dressing: Secondary | ICD-10-CM | POA: Diagnosis not present

## 2019-11-15 DIAGNOSIS — N183 Chronic kidney disease, stage 3 unspecified: Secondary | ICD-10-CM | POA: Diagnosis not present

## 2019-11-15 DIAGNOSIS — E1142 Type 2 diabetes mellitus with diabetic polyneuropathy: Secondary | ICD-10-CM | POA: Diagnosis not present

## 2019-11-15 DIAGNOSIS — E1151 Type 2 diabetes mellitus with diabetic peripheral angiopathy without gangrene: Secondary | ICD-10-CM | POA: Diagnosis not present

## 2019-11-15 DIAGNOSIS — E538 Deficiency of other specified B group vitamins: Secondary | ICD-10-CM | POA: Diagnosis not present

## 2019-11-15 DIAGNOSIS — I8392 Asymptomatic varicose veins of left lower extremity: Secondary | ICD-10-CM | POA: Diagnosis not present

## 2019-11-15 DIAGNOSIS — Z23 Encounter for immunization: Secondary | ICD-10-CM | POA: Diagnosis not present

## 2019-11-15 DIAGNOSIS — E1122 Type 2 diabetes mellitus with diabetic chronic kidney disease: Secondary | ICD-10-CM | POA: Diagnosis not present

## 2019-11-15 DIAGNOSIS — L97821 Non-pressure chronic ulcer of other part of left lower leg limited to breakdown of skin: Secondary | ICD-10-CM | POA: Diagnosis not present

## 2019-11-15 DIAGNOSIS — L97811 Non-pressure chronic ulcer of other part of right lower leg limited to breakdown of skin: Secondary | ICD-10-CM | POA: Diagnosis not present

## 2019-11-15 DIAGNOSIS — I87313 Chronic venous hypertension (idiopathic) with ulcer of bilateral lower extremity: Secondary | ICD-10-CM | POA: Diagnosis not present

## 2019-11-19 DIAGNOSIS — I87313 Chronic venous hypertension (idiopathic) with ulcer of bilateral lower extremity: Secondary | ICD-10-CM | POA: Diagnosis not present

## 2019-11-19 DIAGNOSIS — L97821 Non-pressure chronic ulcer of other part of left lower leg limited to breakdown of skin: Secondary | ICD-10-CM | POA: Diagnosis not present

## 2019-11-19 DIAGNOSIS — E1122 Type 2 diabetes mellitus with diabetic chronic kidney disease: Secondary | ICD-10-CM | POA: Diagnosis not present

## 2019-11-19 DIAGNOSIS — Z7984 Long term (current) use of oral hypoglycemic drugs: Secondary | ICD-10-CM | POA: Diagnosis not present

## 2019-11-19 DIAGNOSIS — E1142 Type 2 diabetes mellitus with diabetic polyneuropathy: Secondary | ICD-10-CM | POA: Diagnosis not present

## 2019-11-19 DIAGNOSIS — I129 Hypertensive chronic kidney disease with stage 1 through stage 4 chronic kidney disease, or unspecified chronic kidney disease: Secondary | ICD-10-CM | POA: Diagnosis not present

## 2019-11-19 DIAGNOSIS — I482 Chronic atrial fibrillation, unspecified: Secondary | ICD-10-CM | POA: Diagnosis not present

## 2019-11-19 DIAGNOSIS — E1151 Type 2 diabetes mellitus with diabetic peripheral angiopathy without gangrene: Secondary | ICD-10-CM | POA: Diagnosis not present

## 2019-11-19 DIAGNOSIS — L97811 Non-pressure chronic ulcer of other part of right lower leg limited to breakdown of skin: Secondary | ICD-10-CM | POA: Diagnosis not present

## 2019-11-19 DIAGNOSIS — Z4800 Encounter for change or removal of nonsurgical wound dressing: Secondary | ICD-10-CM | POA: Diagnosis not present

## 2019-11-19 DIAGNOSIS — N183 Chronic kidney disease, stage 3 unspecified: Secondary | ICD-10-CM | POA: Diagnosis not present

## 2019-11-22 DIAGNOSIS — Z7984 Long term (current) use of oral hypoglycemic drugs: Secondary | ICD-10-CM | POA: Diagnosis not present

## 2019-11-22 DIAGNOSIS — I482 Chronic atrial fibrillation, unspecified: Secondary | ICD-10-CM | POA: Diagnosis not present

## 2019-11-22 DIAGNOSIS — E1151 Type 2 diabetes mellitus with diabetic peripheral angiopathy without gangrene: Secondary | ICD-10-CM | POA: Diagnosis not present

## 2019-11-22 DIAGNOSIS — L97821 Non-pressure chronic ulcer of other part of left lower leg limited to breakdown of skin: Secondary | ICD-10-CM | POA: Diagnosis not present

## 2019-11-22 DIAGNOSIS — L97811 Non-pressure chronic ulcer of other part of right lower leg limited to breakdown of skin: Secondary | ICD-10-CM | POA: Diagnosis not present

## 2019-11-22 DIAGNOSIS — E1142 Type 2 diabetes mellitus with diabetic polyneuropathy: Secondary | ICD-10-CM | POA: Diagnosis not present

## 2019-11-22 DIAGNOSIS — E1122 Type 2 diabetes mellitus with diabetic chronic kidney disease: Secondary | ICD-10-CM | POA: Diagnosis not present

## 2019-11-22 DIAGNOSIS — Z4800 Encounter for change or removal of nonsurgical wound dressing: Secondary | ICD-10-CM | POA: Diagnosis not present

## 2019-11-22 DIAGNOSIS — I87313 Chronic venous hypertension (idiopathic) with ulcer of bilateral lower extremity: Secondary | ICD-10-CM | POA: Diagnosis not present

## 2019-11-22 DIAGNOSIS — I129 Hypertensive chronic kidney disease with stage 1 through stage 4 chronic kidney disease, or unspecified chronic kidney disease: Secondary | ICD-10-CM | POA: Diagnosis not present

## 2019-11-22 DIAGNOSIS — N183 Chronic kidney disease, stage 3 unspecified: Secondary | ICD-10-CM | POA: Diagnosis not present

## 2019-11-26 DIAGNOSIS — L97811 Non-pressure chronic ulcer of other part of right lower leg limited to breakdown of skin: Secondary | ICD-10-CM | POA: Diagnosis not present

## 2019-11-26 DIAGNOSIS — Z7984 Long term (current) use of oral hypoglycemic drugs: Secondary | ICD-10-CM | POA: Diagnosis not present

## 2019-11-26 DIAGNOSIS — Z4800 Encounter for change or removal of nonsurgical wound dressing: Secondary | ICD-10-CM | POA: Diagnosis not present

## 2019-11-26 DIAGNOSIS — N183 Chronic kidney disease, stage 3 unspecified: Secondary | ICD-10-CM | POA: Diagnosis not present

## 2019-11-26 DIAGNOSIS — E1142 Type 2 diabetes mellitus with diabetic polyneuropathy: Secondary | ICD-10-CM | POA: Diagnosis not present

## 2019-11-26 DIAGNOSIS — E1151 Type 2 diabetes mellitus with diabetic peripheral angiopathy without gangrene: Secondary | ICD-10-CM | POA: Diagnosis not present

## 2019-11-26 DIAGNOSIS — L97821 Non-pressure chronic ulcer of other part of left lower leg limited to breakdown of skin: Secondary | ICD-10-CM | POA: Diagnosis not present

## 2019-11-26 DIAGNOSIS — I482 Chronic atrial fibrillation, unspecified: Secondary | ICD-10-CM | POA: Diagnosis not present

## 2019-11-26 DIAGNOSIS — I87313 Chronic venous hypertension (idiopathic) with ulcer of bilateral lower extremity: Secondary | ICD-10-CM | POA: Diagnosis not present

## 2019-11-26 DIAGNOSIS — E1122 Type 2 diabetes mellitus with diabetic chronic kidney disease: Secondary | ICD-10-CM | POA: Diagnosis not present

## 2019-11-26 DIAGNOSIS — I129 Hypertensive chronic kidney disease with stage 1 through stage 4 chronic kidney disease, or unspecified chronic kidney disease: Secondary | ICD-10-CM | POA: Diagnosis not present

## 2019-11-27 DIAGNOSIS — E1151 Type 2 diabetes mellitus with diabetic peripheral angiopathy without gangrene: Secondary | ICD-10-CM | POA: Diagnosis not present

## 2019-11-27 DIAGNOSIS — E1140 Type 2 diabetes mellitus with diabetic neuropathy, unspecified: Secondary | ICD-10-CM | POA: Diagnosis not present

## 2019-11-28 DIAGNOSIS — Z23 Encounter for immunization: Secondary | ICD-10-CM | POA: Diagnosis not present

## 2019-11-29 DIAGNOSIS — E1151 Type 2 diabetes mellitus with diabetic peripheral angiopathy without gangrene: Secondary | ICD-10-CM | POA: Diagnosis not present

## 2019-11-29 DIAGNOSIS — Z7984 Long term (current) use of oral hypoglycemic drugs: Secondary | ICD-10-CM | POA: Diagnosis not present

## 2019-11-29 DIAGNOSIS — Z4800 Encounter for change or removal of nonsurgical wound dressing: Secondary | ICD-10-CM | POA: Diagnosis not present

## 2019-11-29 DIAGNOSIS — I87313 Chronic venous hypertension (idiopathic) with ulcer of bilateral lower extremity: Secondary | ICD-10-CM | POA: Diagnosis not present

## 2019-11-29 DIAGNOSIS — I129 Hypertensive chronic kidney disease with stage 1 through stage 4 chronic kidney disease, or unspecified chronic kidney disease: Secondary | ICD-10-CM | POA: Diagnosis not present

## 2019-11-29 DIAGNOSIS — N183 Chronic kidney disease, stage 3 unspecified: Secondary | ICD-10-CM | POA: Diagnosis not present

## 2019-11-29 DIAGNOSIS — L97811 Non-pressure chronic ulcer of other part of right lower leg limited to breakdown of skin: Secondary | ICD-10-CM | POA: Diagnosis not present

## 2019-11-29 DIAGNOSIS — E1122 Type 2 diabetes mellitus with diabetic chronic kidney disease: Secondary | ICD-10-CM | POA: Diagnosis not present

## 2019-11-29 DIAGNOSIS — I482 Chronic atrial fibrillation, unspecified: Secondary | ICD-10-CM | POA: Diagnosis not present

## 2019-11-29 DIAGNOSIS — E1142 Type 2 diabetes mellitus with diabetic polyneuropathy: Secondary | ICD-10-CM | POA: Diagnosis not present

## 2019-11-29 DIAGNOSIS — L97821 Non-pressure chronic ulcer of other part of left lower leg limited to breakdown of skin: Secondary | ICD-10-CM | POA: Diagnosis not present

## 2019-12-03 DIAGNOSIS — I87313 Chronic venous hypertension (idiopathic) with ulcer of bilateral lower extremity: Secondary | ICD-10-CM | POA: Diagnosis not present

## 2019-12-03 DIAGNOSIS — L97811 Non-pressure chronic ulcer of other part of right lower leg limited to breakdown of skin: Secondary | ICD-10-CM | POA: Diagnosis not present

## 2019-12-03 DIAGNOSIS — I129 Hypertensive chronic kidney disease with stage 1 through stage 4 chronic kidney disease, or unspecified chronic kidney disease: Secondary | ICD-10-CM | POA: Diagnosis not present

## 2019-12-03 DIAGNOSIS — I482 Chronic atrial fibrillation, unspecified: Secondary | ICD-10-CM | POA: Diagnosis not present

## 2019-12-03 DIAGNOSIS — Z4800 Encounter for change or removal of nonsurgical wound dressing: Secondary | ICD-10-CM | POA: Diagnosis not present

## 2019-12-03 DIAGNOSIS — Z7984 Long term (current) use of oral hypoglycemic drugs: Secondary | ICD-10-CM | POA: Diagnosis not present

## 2019-12-03 DIAGNOSIS — N183 Chronic kidney disease, stage 3 unspecified: Secondary | ICD-10-CM | POA: Diagnosis not present

## 2019-12-03 DIAGNOSIS — E1142 Type 2 diabetes mellitus with diabetic polyneuropathy: Secondary | ICD-10-CM | POA: Diagnosis not present

## 2019-12-03 DIAGNOSIS — E1122 Type 2 diabetes mellitus with diabetic chronic kidney disease: Secondary | ICD-10-CM | POA: Diagnosis not present

## 2019-12-03 DIAGNOSIS — E1151 Type 2 diabetes mellitus with diabetic peripheral angiopathy without gangrene: Secondary | ICD-10-CM | POA: Diagnosis not present

## 2019-12-03 DIAGNOSIS — L97821 Non-pressure chronic ulcer of other part of left lower leg limited to breakdown of skin: Secondary | ICD-10-CM | POA: Diagnosis not present

## 2019-12-05 DIAGNOSIS — L97821 Non-pressure chronic ulcer of other part of left lower leg limited to breakdown of skin: Secondary | ICD-10-CM | POA: Diagnosis not present

## 2019-12-05 DIAGNOSIS — I87313 Chronic venous hypertension (idiopathic) with ulcer of bilateral lower extremity: Secondary | ICD-10-CM | POA: Diagnosis not present

## 2019-12-05 DIAGNOSIS — L97811 Non-pressure chronic ulcer of other part of right lower leg limited to breakdown of skin: Secondary | ICD-10-CM | POA: Diagnosis not present

## 2019-12-05 DIAGNOSIS — Z4800 Encounter for change or removal of nonsurgical wound dressing: Secondary | ICD-10-CM | POA: Diagnosis not present

## 2019-12-05 DIAGNOSIS — E1151 Type 2 diabetes mellitus with diabetic peripheral angiopathy without gangrene: Secondary | ICD-10-CM | POA: Diagnosis not present

## 2019-12-06 DIAGNOSIS — E1122 Type 2 diabetes mellitus with diabetic chronic kidney disease: Secondary | ICD-10-CM | POA: Diagnosis not present

## 2019-12-06 DIAGNOSIS — I87313 Chronic venous hypertension (idiopathic) with ulcer of bilateral lower extremity: Secondary | ICD-10-CM | POA: Diagnosis not present

## 2019-12-06 DIAGNOSIS — E1142 Type 2 diabetes mellitus with diabetic polyneuropathy: Secondary | ICD-10-CM | POA: Diagnosis not present

## 2019-12-06 DIAGNOSIS — L97821 Non-pressure chronic ulcer of other part of left lower leg limited to breakdown of skin: Secondary | ICD-10-CM | POA: Diagnosis not present

## 2019-12-06 DIAGNOSIS — E1151 Type 2 diabetes mellitus with diabetic peripheral angiopathy without gangrene: Secondary | ICD-10-CM | POA: Diagnosis not present

## 2019-12-06 DIAGNOSIS — Z7984 Long term (current) use of oral hypoglycemic drugs: Secondary | ICD-10-CM | POA: Diagnosis not present

## 2019-12-06 DIAGNOSIS — I482 Chronic atrial fibrillation, unspecified: Secondary | ICD-10-CM | POA: Diagnosis not present

## 2019-12-06 DIAGNOSIS — I129 Hypertensive chronic kidney disease with stage 1 through stage 4 chronic kidney disease, or unspecified chronic kidney disease: Secondary | ICD-10-CM | POA: Diagnosis not present

## 2019-12-06 DIAGNOSIS — N183 Chronic kidney disease, stage 3 unspecified: Secondary | ICD-10-CM | POA: Diagnosis not present

## 2019-12-06 DIAGNOSIS — L97811 Non-pressure chronic ulcer of other part of right lower leg limited to breakdown of skin: Secondary | ICD-10-CM | POA: Diagnosis not present

## 2019-12-06 DIAGNOSIS — Z4800 Encounter for change or removal of nonsurgical wound dressing: Secondary | ICD-10-CM | POA: Diagnosis not present

## 2019-12-10 DIAGNOSIS — L97821 Non-pressure chronic ulcer of other part of left lower leg limited to breakdown of skin: Secondary | ICD-10-CM | POA: Diagnosis not present

## 2019-12-10 DIAGNOSIS — I129 Hypertensive chronic kidney disease with stage 1 through stage 4 chronic kidney disease, or unspecified chronic kidney disease: Secondary | ICD-10-CM | POA: Diagnosis not present

## 2019-12-10 DIAGNOSIS — I482 Chronic atrial fibrillation, unspecified: Secondary | ICD-10-CM | POA: Diagnosis not present

## 2019-12-10 DIAGNOSIS — N183 Chronic kidney disease, stage 3 unspecified: Secondary | ICD-10-CM | POA: Diagnosis not present

## 2019-12-10 DIAGNOSIS — L97811 Non-pressure chronic ulcer of other part of right lower leg limited to breakdown of skin: Secondary | ICD-10-CM | POA: Diagnosis not present

## 2019-12-10 DIAGNOSIS — E1122 Type 2 diabetes mellitus with diabetic chronic kidney disease: Secondary | ICD-10-CM | POA: Diagnosis not present

## 2019-12-10 DIAGNOSIS — Z4800 Encounter for change or removal of nonsurgical wound dressing: Secondary | ICD-10-CM | POA: Diagnosis not present

## 2019-12-10 DIAGNOSIS — E1142 Type 2 diabetes mellitus with diabetic polyneuropathy: Secondary | ICD-10-CM | POA: Diagnosis not present

## 2019-12-10 DIAGNOSIS — E1151 Type 2 diabetes mellitus with diabetic peripheral angiopathy without gangrene: Secondary | ICD-10-CM | POA: Diagnosis not present

## 2019-12-10 DIAGNOSIS — Z7984 Long term (current) use of oral hypoglycemic drugs: Secondary | ICD-10-CM | POA: Diagnosis not present

## 2019-12-10 DIAGNOSIS — I87313 Chronic venous hypertension (idiopathic) with ulcer of bilateral lower extremity: Secondary | ICD-10-CM | POA: Diagnosis not present

## 2019-12-14 DIAGNOSIS — Z7984 Long term (current) use of oral hypoglycemic drugs: Secondary | ICD-10-CM | POA: Diagnosis not present

## 2019-12-14 DIAGNOSIS — I87313 Chronic venous hypertension (idiopathic) with ulcer of bilateral lower extremity: Secondary | ICD-10-CM | POA: Diagnosis not present

## 2019-12-14 DIAGNOSIS — I482 Chronic atrial fibrillation, unspecified: Secondary | ICD-10-CM | POA: Diagnosis not present

## 2019-12-14 DIAGNOSIS — I129 Hypertensive chronic kidney disease with stage 1 through stage 4 chronic kidney disease, or unspecified chronic kidney disease: Secondary | ICD-10-CM | POA: Diagnosis not present

## 2019-12-14 DIAGNOSIS — L97811 Non-pressure chronic ulcer of other part of right lower leg limited to breakdown of skin: Secondary | ICD-10-CM | POA: Diagnosis not present

## 2019-12-14 DIAGNOSIS — E1142 Type 2 diabetes mellitus with diabetic polyneuropathy: Secondary | ICD-10-CM | POA: Diagnosis not present

## 2019-12-14 DIAGNOSIS — L97821 Non-pressure chronic ulcer of other part of left lower leg limited to breakdown of skin: Secondary | ICD-10-CM | POA: Diagnosis not present

## 2019-12-14 DIAGNOSIS — E1122 Type 2 diabetes mellitus with diabetic chronic kidney disease: Secondary | ICD-10-CM | POA: Diagnosis not present

## 2019-12-14 DIAGNOSIS — Z4800 Encounter for change or removal of nonsurgical wound dressing: Secondary | ICD-10-CM | POA: Diagnosis not present

## 2019-12-14 DIAGNOSIS — E1151 Type 2 diabetes mellitus with diabetic peripheral angiopathy without gangrene: Secondary | ICD-10-CM | POA: Diagnosis not present

## 2019-12-14 DIAGNOSIS — N183 Chronic kidney disease, stage 3 unspecified: Secondary | ICD-10-CM | POA: Diagnosis not present

## 2019-12-22 DIAGNOSIS — I87313 Chronic venous hypertension (idiopathic) with ulcer of bilateral lower extremity: Secondary | ICD-10-CM | POA: Diagnosis not present

## 2019-12-22 DIAGNOSIS — I129 Hypertensive chronic kidney disease with stage 1 through stage 4 chronic kidney disease, or unspecified chronic kidney disease: Secondary | ICD-10-CM | POA: Diagnosis not present

## 2019-12-22 DIAGNOSIS — I482 Chronic atrial fibrillation, unspecified: Secondary | ICD-10-CM | POA: Diagnosis not present

## 2019-12-22 DIAGNOSIS — Z7984 Long term (current) use of oral hypoglycemic drugs: Secondary | ICD-10-CM | POA: Diagnosis not present

## 2019-12-22 DIAGNOSIS — L97811 Non-pressure chronic ulcer of other part of right lower leg limited to breakdown of skin: Secondary | ICD-10-CM | POA: Diagnosis not present

## 2019-12-22 DIAGNOSIS — Z4800 Encounter for change or removal of nonsurgical wound dressing: Secondary | ICD-10-CM | POA: Diagnosis not present

## 2019-12-22 DIAGNOSIS — E1122 Type 2 diabetes mellitus with diabetic chronic kidney disease: Secondary | ICD-10-CM | POA: Diagnosis not present

## 2019-12-22 DIAGNOSIS — N183 Chronic kidney disease, stage 3 unspecified: Secondary | ICD-10-CM | POA: Diagnosis not present

## 2019-12-22 DIAGNOSIS — E1142 Type 2 diabetes mellitus with diabetic polyneuropathy: Secondary | ICD-10-CM | POA: Diagnosis not present

## 2019-12-22 DIAGNOSIS — E1151 Type 2 diabetes mellitus with diabetic peripheral angiopathy without gangrene: Secondary | ICD-10-CM | POA: Diagnosis not present

## 2019-12-22 DIAGNOSIS — L97821 Non-pressure chronic ulcer of other part of left lower leg limited to breakdown of skin: Secondary | ICD-10-CM | POA: Diagnosis not present

## 2019-12-27 DIAGNOSIS — E1122 Type 2 diabetes mellitus with diabetic chronic kidney disease: Secondary | ICD-10-CM | POA: Diagnosis not present

## 2019-12-27 DIAGNOSIS — N183 Chronic kidney disease, stage 3 unspecified: Secondary | ICD-10-CM | POA: Diagnosis not present

## 2019-12-27 DIAGNOSIS — L97811 Non-pressure chronic ulcer of other part of right lower leg limited to breakdown of skin: Secondary | ICD-10-CM | POA: Diagnosis not present

## 2019-12-27 DIAGNOSIS — I482 Chronic atrial fibrillation, unspecified: Secondary | ICD-10-CM | POA: Diagnosis not present

## 2019-12-27 DIAGNOSIS — E1151 Type 2 diabetes mellitus with diabetic peripheral angiopathy without gangrene: Secondary | ICD-10-CM | POA: Diagnosis not present

## 2019-12-27 DIAGNOSIS — E1142 Type 2 diabetes mellitus with diabetic polyneuropathy: Secondary | ICD-10-CM | POA: Diagnosis not present

## 2019-12-27 DIAGNOSIS — L97821 Non-pressure chronic ulcer of other part of left lower leg limited to breakdown of skin: Secondary | ICD-10-CM | POA: Diagnosis not present

## 2019-12-27 DIAGNOSIS — Z4800 Encounter for change or removal of nonsurgical wound dressing: Secondary | ICD-10-CM | POA: Diagnosis not present

## 2019-12-27 DIAGNOSIS — I87313 Chronic venous hypertension (idiopathic) with ulcer of bilateral lower extremity: Secondary | ICD-10-CM | POA: Diagnosis not present

## 2019-12-27 DIAGNOSIS — Z7984 Long term (current) use of oral hypoglycemic drugs: Secondary | ICD-10-CM | POA: Diagnosis not present

## 2019-12-27 DIAGNOSIS — I129 Hypertensive chronic kidney disease with stage 1 through stage 4 chronic kidney disease, or unspecified chronic kidney disease: Secondary | ICD-10-CM | POA: Diagnosis not present

## 2020-01-03 DIAGNOSIS — I87313 Chronic venous hypertension (idiopathic) with ulcer of bilateral lower extremity: Secondary | ICD-10-CM | POA: Diagnosis not present

## 2020-01-03 DIAGNOSIS — L97821 Non-pressure chronic ulcer of other part of left lower leg limited to breakdown of skin: Secondary | ICD-10-CM | POA: Diagnosis not present

## 2020-01-03 DIAGNOSIS — N183 Chronic kidney disease, stage 3 unspecified: Secondary | ICD-10-CM | POA: Diagnosis not present

## 2020-01-03 DIAGNOSIS — Z7984 Long term (current) use of oral hypoglycemic drugs: Secondary | ICD-10-CM | POA: Diagnosis not present

## 2020-01-03 DIAGNOSIS — L97811 Non-pressure chronic ulcer of other part of right lower leg limited to breakdown of skin: Secondary | ICD-10-CM | POA: Diagnosis not present

## 2020-01-03 DIAGNOSIS — I129 Hypertensive chronic kidney disease with stage 1 through stage 4 chronic kidney disease, or unspecified chronic kidney disease: Secondary | ICD-10-CM | POA: Diagnosis not present

## 2020-01-03 DIAGNOSIS — I482 Chronic atrial fibrillation, unspecified: Secondary | ICD-10-CM | POA: Diagnosis not present

## 2020-01-03 DIAGNOSIS — E1142 Type 2 diabetes mellitus with diabetic polyneuropathy: Secondary | ICD-10-CM | POA: Diagnosis not present

## 2020-01-03 DIAGNOSIS — E1151 Type 2 diabetes mellitus with diabetic peripheral angiopathy without gangrene: Secondary | ICD-10-CM | POA: Diagnosis not present

## 2020-01-03 DIAGNOSIS — Z4800 Encounter for change or removal of nonsurgical wound dressing: Secondary | ICD-10-CM | POA: Diagnosis not present

## 2020-01-03 DIAGNOSIS — E1122 Type 2 diabetes mellitus with diabetic chronic kidney disease: Secondary | ICD-10-CM | POA: Diagnosis not present

## 2020-01-07 DIAGNOSIS — I129 Hypertensive chronic kidney disease with stage 1 through stage 4 chronic kidney disease, or unspecified chronic kidney disease: Secondary | ICD-10-CM | POA: Diagnosis not present

## 2020-01-07 DIAGNOSIS — N183 Chronic kidney disease, stage 3 unspecified: Secondary | ICD-10-CM | POA: Diagnosis not present

## 2020-01-07 DIAGNOSIS — L97811 Non-pressure chronic ulcer of other part of right lower leg limited to breakdown of skin: Secondary | ICD-10-CM | POA: Diagnosis not present

## 2020-01-07 DIAGNOSIS — I87313 Chronic venous hypertension (idiopathic) with ulcer of bilateral lower extremity: Secondary | ICD-10-CM | POA: Diagnosis not present

## 2020-01-07 DIAGNOSIS — E1142 Type 2 diabetes mellitus with diabetic polyneuropathy: Secondary | ICD-10-CM | POA: Diagnosis not present

## 2020-01-07 DIAGNOSIS — Z4800 Encounter for change or removal of nonsurgical wound dressing: Secondary | ICD-10-CM | POA: Diagnosis not present

## 2020-01-07 DIAGNOSIS — I482 Chronic atrial fibrillation, unspecified: Secondary | ICD-10-CM | POA: Diagnosis not present

## 2020-01-07 DIAGNOSIS — Z7984 Long term (current) use of oral hypoglycemic drugs: Secondary | ICD-10-CM | POA: Diagnosis not present

## 2020-01-07 DIAGNOSIS — E1122 Type 2 diabetes mellitus with diabetic chronic kidney disease: Secondary | ICD-10-CM | POA: Diagnosis not present

## 2020-01-07 DIAGNOSIS — E1151 Type 2 diabetes mellitus with diabetic peripheral angiopathy without gangrene: Secondary | ICD-10-CM | POA: Diagnosis not present

## 2020-01-07 DIAGNOSIS — L97821 Non-pressure chronic ulcer of other part of left lower leg limited to breakdown of skin: Secondary | ICD-10-CM | POA: Diagnosis not present

## 2020-01-10 DIAGNOSIS — Z4800 Encounter for change or removal of nonsurgical wound dressing: Secondary | ICD-10-CM | POA: Diagnosis not present

## 2020-01-10 DIAGNOSIS — N183 Chronic kidney disease, stage 3 unspecified: Secondary | ICD-10-CM | POA: Diagnosis not present

## 2020-01-10 DIAGNOSIS — I482 Chronic atrial fibrillation, unspecified: Secondary | ICD-10-CM | POA: Diagnosis not present

## 2020-01-10 DIAGNOSIS — E1142 Type 2 diabetes mellitus with diabetic polyneuropathy: Secondary | ICD-10-CM | POA: Diagnosis not present

## 2020-01-10 DIAGNOSIS — L97811 Non-pressure chronic ulcer of other part of right lower leg limited to breakdown of skin: Secondary | ICD-10-CM | POA: Diagnosis not present

## 2020-01-10 DIAGNOSIS — Z7984 Long term (current) use of oral hypoglycemic drugs: Secondary | ICD-10-CM | POA: Diagnosis not present

## 2020-01-10 DIAGNOSIS — E1151 Type 2 diabetes mellitus with diabetic peripheral angiopathy without gangrene: Secondary | ICD-10-CM | POA: Diagnosis not present

## 2020-01-10 DIAGNOSIS — E1122 Type 2 diabetes mellitus with diabetic chronic kidney disease: Secondary | ICD-10-CM | POA: Diagnosis not present

## 2020-01-10 DIAGNOSIS — L97821 Non-pressure chronic ulcer of other part of left lower leg limited to breakdown of skin: Secondary | ICD-10-CM | POA: Diagnosis not present

## 2020-01-10 DIAGNOSIS — I87313 Chronic venous hypertension (idiopathic) with ulcer of bilateral lower extremity: Secondary | ICD-10-CM | POA: Diagnosis not present

## 2020-01-10 DIAGNOSIS — I129 Hypertensive chronic kidney disease with stage 1 through stage 4 chronic kidney disease, or unspecified chronic kidney disease: Secondary | ICD-10-CM | POA: Diagnosis not present

## 2020-01-14 DIAGNOSIS — L97821 Non-pressure chronic ulcer of other part of left lower leg limited to breakdown of skin: Secondary | ICD-10-CM | POA: Diagnosis not present

## 2020-01-14 DIAGNOSIS — E1122 Type 2 diabetes mellitus with diabetic chronic kidney disease: Secondary | ICD-10-CM | POA: Diagnosis not present

## 2020-01-14 DIAGNOSIS — L97811 Non-pressure chronic ulcer of other part of right lower leg limited to breakdown of skin: Secondary | ICD-10-CM | POA: Diagnosis not present

## 2020-01-14 DIAGNOSIS — E1142 Type 2 diabetes mellitus with diabetic polyneuropathy: Secondary | ICD-10-CM | POA: Diagnosis not present

## 2020-01-14 DIAGNOSIS — I87313 Chronic venous hypertension (idiopathic) with ulcer of bilateral lower extremity: Secondary | ICD-10-CM | POA: Diagnosis not present

## 2020-01-14 DIAGNOSIS — Z7984 Long term (current) use of oral hypoglycemic drugs: Secondary | ICD-10-CM | POA: Diagnosis not present

## 2020-01-14 DIAGNOSIS — Z4800 Encounter for change or removal of nonsurgical wound dressing: Secondary | ICD-10-CM | POA: Diagnosis not present

## 2020-01-14 DIAGNOSIS — E1151 Type 2 diabetes mellitus with diabetic peripheral angiopathy without gangrene: Secondary | ICD-10-CM | POA: Diagnosis not present

## 2020-01-14 DIAGNOSIS — N183 Chronic kidney disease, stage 3 unspecified: Secondary | ICD-10-CM | POA: Diagnosis not present

## 2020-01-14 DIAGNOSIS — I482 Chronic atrial fibrillation, unspecified: Secondary | ICD-10-CM | POA: Diagnosis not present

## 2020-01-14 DIAGNOSIS — I129 Hypertensive chronic kidney disease with stage 1 through stage 4 chronic kidney disease, or unspecified chronic kidney disease: Secondary | ICD-10-CM | POA: Diagnosis not present

## 2020-01-17 DIAGNOSIS — E1122 Type 2 diabetes mellitus with diabetic chronic kidney disease: Secondary | ICD-10-CM | POA: Diagnosis not present

## 2020-01-17 DIAGNOSIS — Z7984 Long term (current) use of oral hypoglycemic drugs: Secondary | ICD-10-CM | POA: Diagnosis not present

## 2020-01-17 DIAGNOSIS — L97811 Non-pressure chronic ulcer of other part of right lower leg limited to breakdown of skin: Secondary | ICD-10-CM | POA: Diagnosis not present

## 2020-01-17 DIAGNOSIS — L97821 Non-pressure chronic ulcer of other part of left lower leg limited to breakdown of skin: Secondary | ICD-10-CM | POA: Diagnosis not present

## 2020-01-17 DIAGNOSIS — I87313 Chronic venous hypertension (idiopathic) with ulcer of bilateral lower extremity: Secondary | ICD-10-CM | POA: Diagnosis not present

## 2020-01-17 DIAGNOSIS — Z4800 Encounter for change or removal of nonsurgical wound dressing: Secondary | ICD-10-CM | POA: Diagnosis not present

## 2020-01-17 DIAGNOSIS — E1151 Type 2 diabetes mellitus with diabetic peripheral angiopathy without gangrene: Secondary | ICD-10-CM | POA: Diagnosis not present

## 2020-01-17 DIAGNOSIS — I482 Chronic atrial fibrillation, unspecified: Secondary | ICD-10-CM | POA: Diagnosis not present

## 2020-01-17 DIAGNOSIS — I129 Hypertensive chronic kidney disease with stage 1 through stage 4 chronic kidney disease, or unspecified chronic kidney disease: Secondary | ICD-10-CM | POA: Diagnosis not present

## 2020-01-17 DIAGNOSIS — E1142 Type 2 diabetes mellitus with diabetic polyneuropathy: Secondary | ICD-10-CM | POA: Diagnosis not present

## 2020-01-17 DIAGNOSIS — N183 Chronic kidney disease, stage 3 unspecified: Secondary | ICD-10-CM | POA: Diagnosis not present

## 2020-01-21 DIAGNOSIS — N183 Chronic kidney disease, stage 3 unspecified: Secondary | ICD-10-CM | POA: Diagnosis not present

## 2020-01-21 DIAGNOSIS — I87313 Chronic venous hypertension (idiopathic) with ulcer of bilateral lower extremity: Secondary | ICD-10-CM | POA: Diagnosis not present

## 2020-01-21 DIAGNOSIS — I129 Hypertensive chronic kidney disease with stage 1 through stage 4 chronic kidney disease, or unspecified chronic kidney disease: Secondary | ICD-10-CM | POA: Diagnosis not present

## 2020-01-21 DIAGNOSIS — E1122 Type 2 diabetes mellitus with diabetic chronic kidney disease: Secondary | ICD-10-CM | POA: Diagnosis not present

## 2020-01-21 DIAGNOSIS — E1151 Type 2 diabetes mellitus with diabetic peripheral angiopathy without gangrene: Secondary | ICD-10-CM | POA: Diagnosis not present

## 2020-01-21 DIAGNOSIS — L97811 Non-pressure chronic ulcer of other part of right lower leg limited to breakdown of skin: Secondary | ICD-10-CM | POA: Diagnosis not present

## 2020-01-21 DIAGNOSIS — E1142 Type 2 diabetes mellitus with diabetic polyneuropathy: Secondary | ICD-10-CM | POA: Diagnosis not present

## 2020-01-21 DIAGNOSIS — I482 Chronic atrial fibrillation, unspecified: Secondary | ICD-10-CM | POA: Diagnosis not present

## 2020-01-21 DIAGNOSIS — Z4800 Encounter for change or removal of nonsurgical wound dressing: Secondary | ICD-10-CM | POA: Diagnosis not present

## 2020-01-21 DIAGNOSIS — L97821 Non-pressure chronic ulcer of other part of left lower leg limited to breakdown of skin: Secondary | ICD-10-CM | POA: Diagnosis not present

## 2020-01-21 DIAGNOSIS — Z7984 Long term (current) use of oral hypoglycemic drugs: Secondary | ICD-10-CM | POA: Diagnosis not present

## 2020-01-24 DIAGNOSIS — E1151 Type 2 diabetes mellitus with diabetic peripheral angiopathy without gangrene: Secondary | ICD-10-CM | POA: Diagnosis not present

## 2020-01-24 DIAGNOSIS — Z7984 Long term (current) use of oral hypoglycemic drugs: Secondary | ICD-10-CM | POA: Diagnosis not present

## 2020-01-24 DIAGNOSIS — E1122 Type 2 diabetes mellitus with diabetic chronic kidney disease: Secondary | ICD-10-CM | POA: Diagnosis not present

## 2020-01-24 DIAGNOSIS — I482 Chronic atrial fibrillation, unspecified: Secondary | ICD-10-CM | POA: Diagnosis not present

## 2020-01-24 DIAGNOSIS — I87313 Chronic venous hypertension (idiopathic) with ulcer of bilateral lower extremity: Secondary | ICD-10-CM | POA: Diagnosis not present

## 2020-01-24 DIAGNOSIS — I129 Hypertensive chronic kidney disease with stage 1 through stage 4 chronic kidney disease, or unspecified chronic kidney disease: Secondary | ICD-10-CM | POA: Diagnosis not present

## 2020-01-24 DIAGNOSIS — Z4800 Encounter for change or removal of nonsurgical wound dressing: Secondary | ICD-10-CM | POA: Diagnosis not present

## 2020-01-24 DIAGNOSIS — N183 Chronic kidney disease, stage 3 unspecified: Secondary | ICD-10-CM | POA: Diagnosis not present

## 2020-01-24 DIAGNOSIS — L97811 Non-pressure chronic ulcer of other part of right lower leg limited to breakdown of skin: Secondary | ICD-10-CM | POA: Diagnosis not present

## 2020-01-24 DIAGNOSIS — E1142 Type 2 diabetes mellitus with diabetic polyneuropathy: Secondary | ICD-10-CM | POA: Diagnosis not present

## 2020-01-24 DIAGNOSIS — L97821 Non-pressure chronic ulcer of other part of left lower leg limited to breakdown of skin: Secondary | ICD-10-CM | POA: Diagnosis not present

## 2020-01-28 DIAGNOSIS — N183 Chronic kidney disease, stage 3 unspecified: Secondary | ICD-10-CM | POA: Diagnosis not present

## 2020-01-28 DIAGNOSIS — E1151 Type 2 diabetes mellitus with diabetic peripheral angiopathy without gangrene: Secondary | ICD-10-CM | POA: Diagnosis not present

## 2020-01-28 DIAGNOSIS — I482 Chronic atrial fibrillation, unspecified: Secondary | ICD-10-CM | POA: Diagnosis not present

## 2020-01-28 DIAGNOSIS — E1122 Type 2 diabetes mellitus with diabetic chronic kidney disease: Secondary | ICD-10-CM | POA: Diagnosis not present

## 2020-01-28 DIAGNOSIS — Z7984 Long term (current) use of oral hypoglycemic drugs: Secondary | ICD-10-CM | POA: Diagnosis not present

## 2020-01-28 DIAGNOSIS — I129 Hypertensive chronic kidney disease with stage 1 through stage 4 chronic kidney disease, or unspecified chronic kidney disease: Secondary | ICD-10-CM | POA: Diagnosis not present

## 2020-01-28 DIAGNOSIS — Z4800 Encounter for change or removal of nonsurgical wound dressing: Secondary | ICD-10-CM | POA: Diagnosis not present

## 2020-01-28 DIAGNOSIS — L97821 Non-pressure chronic ulcer of other part of left lower leg limited to breakdown of skin: Secondary | ICD-10-CM | POA: Diagnosis not present

## 2020-01-28 DIAGNOSIS — I87313 Chronic venous hypertension (idiopathic) with ulcer of bilateral lower extremity: Secondary | ICD-10-CM | POA: Diagnosis not present

## 2020-01-28 DIAGNOSIS — E1142 Type 2 diabetes mellitus with diabetic polyneuropathy: Secondary | ICD-10-CM | POA: Diagnosis not present

## 2020-01-28 DIAGNOSIS — L97811 Non-pressure chronic ulcer of other part of right lower leg limited to breakdown of skin: Secondary | ICD-10-CM | POA: Diagnosis not present

## 2020-02-01 DIAGNOSIS — N183 Chronic kidney disease, stage 3 unspecified: Secondary | ICD-10-CM | POA: Diagnosis not present

## 2020-02-01 DIAGNOSIS — I482 Chronic atrial fibrillation, unspecified: Secondary | ICD-10-CM | POA: Diagnosis not present

## 2020-02-01 DIAGNOSIS — L97811 Non-pressure chronic ulcer of other part of right lower leg limited to breakdown of skin: Secondary | ICD-10-CM | POA: Diagnosis not present

## 2020-02-01 DIAGNOSIS — E1142 Type 2 diabetes mellitus with diabetic polyneuropathy: Secondary | ICD-10-CM | POA: Diagnosis not present

## 2020-02-01 DIAGNOSIS — Z4800 Encounter for change or removal of nonsurgical wound dressing: Secondary | ICD-10-CM | POA: Diagnosis not present

## 2020-02-01 DIAGNOSIS — L97821 Non-pressure chronic ulcer of other part of left lower leg limited to breakdown of skin: Secondary | ICD-10-CM | POA: Diagnosis not present

## 2020-02-01 DIAGNOSIS — I129 Hypertensive chronic kidney disease with stage 1 through stage 4 chronic kidney disease, or unspecified chronic kidney disease: Secondary | ICD-10-CM | POA: Diagnosis not present

## 2020-02-01 DIAGNOSIS — E1122 Type 2 diabetes mellitus with diabetic chronic kidney disease: Secondary | ICD-10-CM | POA: Diagnosis not present

## 2020-02-01 DIAGNOSIS — E1151 Type 2 diabetes mellitus with diabetic peripheral angiopathy without gangrene: Secondary | ICD-10-CM | POA: Diagnosis not present

## 2020-02-01 DIAGNOSIS — Z7984 Long term (current) use of oral hypoglycemic drugs: Secondary | ICD-10-CM | POA: Diagnosis not present

## 2020-02-01 DIAGNOSIS — I87313 Chronic venous hypertension (idiopathic) with ulcer of bilateral lower extremity: Secondary | ICD-10-CM | POA: Diagnosis not present

## 2020-02-05 DIAGNOSIS — I482 Chronic atrial fibrillation, unspecified: Secondary | ICD-10-CM | POA: Diagnosis not present

## 2020-02-05 DIAGNOSIS — E1122 Type 2 diabetes mellitus with diabetic chronic kidney disease: Secondary | ICD-10-CM | POA: Diagnosis not present

## 2020-02-05 DIAGNOSIS — N183 Chronic kidney disease, stage 3 unspecified: Secondary | ICD-10-CM | POA: Diagnosis not present

## 2020-02-05 DIAGNOSIS — E1142 Type 2 diabetes mellitus with diabetic polyneuropathy: Secondary | ICD-10-CM | POA: Diagnosis not present

## 2020-02-05 DIAGNOSIS — L97811 Non-pressure chronic ulcer of other part of right lower leg limited to breakdown of skin: Secondary | ICD-10-CM | POA: Diagnosis not present

## 2020-02-05 DIAGNOSIS — L97821 Non-pressure chronic ulcer of other part of left lower leg limited to breakdown of skin: Secondary | ICD-10-CM | POA: Diagnosis not present

## 2020-02-05 DIAGNOSIS — I129 Hypertensive chronic kidney disease with stage 1 through stage 4 chronic kidney disease, or unspecified chronic kidney disease: Secondary | ICD-10-CM | POA: Diagnosis not present

## 2020-02-05 DIAGNOSIS — E1151 Type 2 diabetes mellitus with diabetic peripheral angiopathy without gangrene: Secondary | ICD-10-CM | POA: Diagnosis not present

## 2020-02-05 DIAGNOSIS — I87313 Chronic venous hypertension (idiopathic) with ulcer of bilateral lower extremity: Secondary | ICD-10-CM | POA: Diagnosis not present

## 2020-02-05 DIAGNOSIS — Z7984 Long term (current) use of oral hypoglycemic drugs: Secondary | ICD-10-CM | POA: Diagnosis not present

## 2020-02-05 DIAGNOSIS — Z4800 Encounter for change or removal of nonsurgical wound dressing: Secondary | ICD-10-CM | POA: Diagnosis not present

## 2020-02-07 DIAGNOSIS — L97811 Non-pressure chronic ulcer of other part of right lower leg limited to breakdown of skin: Secondary | ICD-10-CM | POA: Diagnosis not present

## 2020-02-07 DIAGNOSIS — I482 Chronic atrial fibrillation, unspecified: Secondary | ICD-10-CM | POA: Diagnosis not present

## 2020-02-07 DIAGNOSIS — Z7984 Long term (current) use of oral hypoglycemic drugs: Secondary | ICD-10-CM | POA: Diagnosis not present

## 2020-02-07 DIAGNOSIS — E1122 Type 2 diabetes mellitus with diabetic chronic kidney disease: Secondary | ICD-10-CM | POA: Diagnosis not present

## 2020-02-07 DIAGNOSIS — N183 Chronic kidney disease, stage 3 unspecified: Secondary | ICD-10-CM | POA: Diagnosis not present

## 2020-02-07 DIAGNOSIS — E1151 Type 2 diabetes mellitus with diabetic peripheral angiopathy without gangrene: Secondary | ICD-10-CM | POA: Diagnosis not present

## 2020-02-07 DIAGNOSIS — I87313 Chronic venous hypertension (idiopathic) with ulcer of bilateral lower extremity: Secondary | ICD-10-CM | POA: Diagnosis not present

## 2020-02-07 DIAGNOSIS — L97821 Non-pressure chronic ulcer of other part of left lower leg limited to breakdown of skin: Secondary | ICD-10-CM | POA: Diagnosis not present

## 2020-02-07 DIAGNOSIS — Z4800 Encounter for change or removal of nonsurgical wound dressing: Secondary | ICD-10-CM | POA: Diagnosis not present

## 2020-02-07 DIAGNOSIS — I129 Hypertensive chronic kidney disease with stage 1 through stage 4 chronic kidney disease, or unspecified chronic kidney disease: Secondary | ICD-10-CM | POA: Diagnosis not present

## 2020-02-07 DIAGNOSIS — E1142 Type 2 diabetes mellitus with diabetic polyneuropathy: Secondary | ICD-10-CM | POA: Diagnosis not present

## 2020-02-12 DIAGNOSIS — I87313 Chronic venous hypertension (idiopathic) with ulcer of bilateral lower extremity: Secondary | ICD-10-CM | POA: Diagnosis not present

## 2020-02-12 DIAGNOSIS — E1122 Type 2 diabetes mellitus with diabetic chronic kidney disease: Secondary | ICD-10-CM | POA: Diagnosis not present

## 2020-02-12 DIAGNOSIS — I129 Hypertensive chronic kidney disease with stage 1 through stage 4 chronic kidney disease, or unspecified chronic kidney disease: Secondary | ICD-10-CM | POA: Diagnosis not present

## 2020-02-12 DIAGNOSIS — L97811 Non-pressure chronic ulcer of other part of right lower leg limited to breakdown of skin: Secondary | ICD-10-CM | POA: Diagnosis not present

## 2020-02-12 DIAGNOSIS — E1142 Type 2 diabetes mellitus with diabetic polyneuropathy: Secondary | ICD-10-CM | POA: Diagnosis not present

## 2020-02-12 DIAGNOSIS — L97821 Non-pressure chronic ulcer of other part of left lower leg limited to breakdown of skin: Secondary | ICD-10-CM | POA: Diagnosis not present

## 2020-02-12 DIAGNOSIS — Z4800 Encounter for change or removal of nonsurgical wound dressing: Secondary | ICD-10-CM | POA: Diagnosis not present

## 2020-02-12 DIAGNOSIS — Z7984 Long term (current) use of oral hypoglycemic drugs: Secondary | ICD-10-CM | POA: Diagnosis not present

## 2020-02-12 DIAGNOSIS — E1151 Type 2 diabetes mellitus with diabetic peripheral angiopathy without gangrene: Secondary | ICD-10-CM | POA: Diagnosis not present

## 2020-02-12 DIAGNOSIS — N183 Chronic kidney disease, stage 3 unspecified: Secondary | ICD-10-CM | POA: Diagnosis not present

## 2020-02-12 DIAGNOSIS — I482 Chronic atrial fibrillation, unspecified: Secondary | ICD-10-CM | POA: Diagnosis not present

## 2020-02-15 DIAGNOSIS — I482 Chronic atrial fibrillation, unspecified: Secondary | ICD-10-CM | POA: Diagnosis not present

## 2020-02-15 DIAGNOSIS — E1151 Type 2 diabetes mellitus with diabetic peripheral angiopathy without gangrene: Secondary | ICD-10-CM | POA: Diagnosis not present

## 2020-02-15 DIAGNOSIS — Z7984 Long term (current) use of oral hypoglycemic drugs: Secondary | ICD-10-CM | POA: Diagnosis not present

## 2020-02-15 DIAGNOSIS — E1142 Type 2 diabetes mellitus with diabetic polyneuropathy: Secondary | ICD-10-CM | POA: Diagnosis not present

## 2020-02-15 DIAGNOSIS — Z4800 Encounter for change or removal of nonsurgical wound dressing: Secondary | ICD-10-CM | POA: Diagnosis not present

## 2020-02-15 DIAGNOSIS — E1122 Type 2 diabetes mellitus with diabetic chronic kidney disease: Secondary | ICD-10-CM | POA: Diagnosis not present

## 2020-02-15 DIAGNOSIS — L97811 Non-pressure chronic ulcer of other part of right lower leg limited to breakdown of skin: Secondary | ICD-10-CM | POA: Diagnosis not present

## 2020-02-15 DIAGNOSIS — I87313 Chronic venous hypertension (idiopathic) with ulcer of bilateral lower extremity: Secondary | ICD-10-CM | POA: Diagnosis not present

## 2020-02-15 DIAGNOSIS — I129 Hypertensive chronic kidney disease with stage 1 through stage 4 chronic kidney disease, or unspecified chronic kidney disease: Secondary | ICD-10-CM | POA: Diagnosis not present

## 2020-02-15 DIAGNOSIS — L97821 Non-pressure chronic ulcer of other part of left lower leg limited to breakdown of skin: Secondary | ICD-10-CM | POA: Diagnosis not present

## 2020-02-15 DIAGNOSIS — N183 Chronic kidney disease, stage 3 unspecified: Secondary | ICD-10-CM | POA: Diagnosis not present

## 2020-02-19 DIAGNOSIS — I87313 Chronic venous hypertension (idiopathic) with ulcer of bilateral lower extremity: Secondary | ICD-10-CM | POA: Diagnosis not present

## 2020-02-19 DIAGNOSIS — E1142 Type 2 diabetes mellitus with diabetic polyneuropathy: Secondary | ICD-10-CM | POA: Diagnosis not present

## 2020-02-19 DIAGNOSIS — N183 Chronic kidney disease, stage 3 unspecified: Secondary | ICD-10-CM | POA: Diagnosis not present

## 2020-02-19 DIAGNOSIS — Z7984 Long term (current) use of oral hypoglycemic drugs: Secondary | ICD-10-CM | POA: Diagnosis not present

## 2020-02-19 DIAGNOSIS — E1151 Type 2 diabetes mellitus with diabetic peripheral angiopathy without gangrene: Secondary | ICD-10-CM | POA: Diagnosis not present

## 2020-02-19 DIAGNOSIS — H5202 Hypermetropia, left eye: Secondary | ICD-10-CM | POA: Diagnosis not present

## 2020-02-19 DIAGNOSIS — L97821 Non-pressure chronic ulcer of other part of left lower leg limited to breakdown of skin: Secondary | ICD-10-CM | POA: Diagnosis not present

## 2020-02-19 DIAGNOSIS — L97811 Non-pressure chronic ulcer of other part of right lower leg limited to breakdown of skin: Secondary | ICD-10-CM | POA: Diagnosis not present

## 2020-02-19 DIAGNOSIS — E1140 Type 2 diabetes mellitus with diabetic neuropathy, unspecified: Secondary | ICD-10-CM | POA: Diagnosis not present

## 2020-02-19 DIAGNOSIS — I129 Hypertensive chronic kidney disease with stage 1 through stage 4 chronic kidney disease, or unspecified chronic kidney disease: Secondary | ICD-10-CM | POA: Diagnosis not present

## 2020-02-19 DIAGNOSIS — Z4800 Encounter for change or removal of nonsurgical wound dressing: Secondary | ICD-10-CM | POA: Diagnosis not present

## 2020-02-19 DIAGNOSIS — I482 Chronic atrial fibrillation, unspecified: Secondary | ICD-10-CM | POA: Diagnosis not present

## 2020-02-19 DIAGNOSIS — E1122 Type 2 diabetes mellitus with diabetic chronic kidney disease: Secondary | ICD-10-CM | POA: Diagnosis not present

## 2020-02-23 DIAGNOSIS — I482 Chronic atrial fibrillation, unspecified: Secondary | ICD-10-CM | POA: Diagnosis not present

## 2020-02-23 DIAGNOSIS — I129 Hypertensive chronic kidney disease with stage 1 through stage 4 chronic kidney disease, or unspecified chronic kidney disease: Secondary | ICD-10-CM | POA: Diagnosis not present

## 2020-02-23 DIAGNOSIS — E1122 Type 2 diabetes mellitus with diabetic chronic kidney disease: Secondary | ICD-10-CM | POA: Diagnosis not present

## 2020-02-23 DIAGNOSIS — Z4800 Encounter for change or removal of nonsurgical wound dressing: Secondary | ICD-10-CM | POA: Diagnosis not present

## 2020-02-23 DIAGNOSIS — N183 Chronic kidney disease, stage 3 unspecified: Secondary | ICD-10-CM | POA: Diagnosis not present

## 2020-02-23 DIAGNOSIS — L97811 Non-pressure chronic ulcer of other part of right lower leg limited to breakdown of skin: Secondary | ICD-10-CM | POA: Diagnosis not present

## 2020-02-23 DIAGNOSIS — E1151 Type 2 diabetes mellitus with diabetic peripheral angiopathy without gangrene: Secondary | ICD-10-CM | POA: Diagnosis not present

## 2020-02-23 DIAGNOSIS — L97821 Non-pressure chronic ulcer of other part of left lower leg limited to breakdown of skin: Secondary | ICD-10-CM | POA: Diagnosis not present

## 2020-02-23 DIAGNOSIS — Z7984 Long term (current) use of oral hypoglycemic drugs: Secondary | ICD-10-CM | POA: Diagnosis not present

## 2020-02-23 DIAGNOSIS — I87313 Chronic venous hypertension (idiopathic) with ulcer of bilateral lower extremity: Secondary | ICD-10-CM | POA: Diagnosis not present

## 2020-02-23 DIAGNOSIS — E1142 Type 2 diabetes mellitus with diabetic polyneuropathy: Secondary | ICD-10-CM | POA: Diagnosis not present

## 2020-02-25 DIAGNOSIS — Z4800 Encounter for change or removal of nonsurgical wound dressing: Secondary | ICD-10-CM | POA: Diagnosis not present

## 2020-02-25 DIAGNOSIS — L97811 Non-pressure chronic ulcer of other part of right lower leg limited to breakdown of skin: Secondary | ICD-10-CM | POA: Diagnosis not present

## 2020-02-25 DIAGNOSIS — I482 Chronic atrial fibrillation, unspecified: Secondary | ICD-10-CM | POA: Diagnosis not present

## 2020-02-25 DIAGNOSIS — I87313 Chronic venous hypertension (idiopathic) with ulcer of bilateral lower extremity: Secondary | ICD-10-CM | POA: Diagnosis not present

## 2020-02-25 DIAGNOSIS — L97821 Non-pressure chronic ulcer of other part of left lower leg limited to breakdown of skin: Secondary | ICD-10-CM | POA: Diagnosis not present

## 2020-02-25 DIAGNOSIS — Z7984 Long term (current) use of oral hypoglycemic drugs: Secondary | ICD-10-CM | POA: Diagnosis not present

## 2020-02-25 DIAGNOSIS — E1151 Type 2 diabetes mellitus with diabetic peripheral angiopathy without gangrene: Secondary | ICD-10-CM | POA: Diagnosis not present

## 2020-02-25 DIAGNOSIS — I129 Hypertensive chronic kidney disease with stage 1 through stage 4 chronic kidney disease, or unspecified chronic kidney disease: Secondary | ICD-10-CM | POA: Diagnosis not present

## 2020-02-25 DIAGNOSIS — E1142 Type 2 diabetes mellitus with diabetic polyneuropathy: Secondary | ICD-10-CM | POA: Diagnosis not present

## 2020-02-25 DIAGNOSIS — E1122 Type 2 diabetes mellitus with diabetic chronic kidney disease: Secondary | ICD-10-CM | POA: Diagnosis not present

## 2020-02-25 DIAGNOSIS — N183 Chronic kidney disease, stage 3 unspecified: Secondary | ICD-10-CM | POA: Diagnosis not present

## 2020-02-28 DIAGNOSIS — E1142 Type 2 diabetes mellitus with diabetic polyneuropathy: Secondary | ICD-10-CM | POA: Diagnosis not present

## 2020-02-28 DIAGNOSIS — E1122 Type 2 diabetes mellitus with diabetic chronic kidney disease: Secondary | ICD-10-CM | POA: Diagnosis not present

## 2020-02-28 DIAGNOSIS — Z7984 Long term (current) use of oral hypoglycemic drugs: Secondary | ICD-10-CM | POA: Diagnosis not present

## 2020-02-28 DIAGNOSIS — I129 Hypertensive chronic kidney disease with stage 1 through stage 4 chronic kidney disease, or unspecified chronic kidney disease: Secondary | ICD-10-CM | POA: Diagnosis not present

## 2020-02-28 DIAGNOSIS — N183 Chronic kidney disease, stage 3 unspecified: Secondary | ICD-10-CM | POA: Diagnosis not present

## 2020-02-28 DIAGNOSIS — E1151 Type 2 diabetes mellitus with diabetic peripheral angiopathy without gangrene: Secondary | ICD-10-CM | POA: Diagnosis not present

## 2020-02-28 DIAGNOSIS — L97821 Non-pressure chronic ulcer of other part of left lower leg limited to breakdown of skin: Secondary | ICD-10-CM | POA: Diagnosis not present

## 2020-02-28 DIAGNOSIS — Z4800 Encounter for change or removal of nonsurgical wound dressing: Secondary | ICD-10-CM | POA: Diagnosis not present

## 2020-02-28 DIAGNOSIS — I87313 Chronic venous hypertension (idiopathic) with ulcer of bilateral lower extremity: Secondary | ICD-10-CM | POA: Diagnosis not present

## 2020-02-28 DIAGNOSIS — L97811 Non-pressure chronic ulcer of other part of right lower leg limited to breakdown of skin: Secondary | ICD-10-CM | POA: Diagnosis not present

## 2020-02-28 DIAGNOSIS — I482 Chronic atrial fibrillation, unspecified: Secondary | ICD-10-CM | POA: Diagnosis not present

## 2020-03-03 DIAGNOSIS — I129 Hypertensive chronic kidney disease with stage 1 through stage 4 chronic kidney disease, or unspecified chronic kidney disease: Secondary | ICD-10-CM | POA: Diagnosis not present

## 2020-03-03 DIAGNOSIS — E1151 Type 2 diabetes mellitus with diabetic peripheral angiopathy without gangrene: Secondary | ICD-10-CM | POA: Diagnosis not present

## 2020-03-03 DIAGNOSIS — E1122 Type 2 diabetes mellitus with diabetic chronic kidney disease: Secondary | ICD-10-CM | POA: Diagnosis not present

## 2020-03-03 DIAGNOSIS — L97821 Non-pressure chronic ulcer of other part of left lower leg limited to breakdown of skin: Secondary | ICD-10-CM | POA: Diagnosis not present

## 2020-03-03 DIAGNOSIS — Z4800 Encounter for change or removal of nonsurgical wound dressing: Secondary | ICD-10-CM | POA: Diagnosis not present

## 2020-03-03 DIAGNOSIS — L97811 Non-pressure chronic ulcer of other part of right lower leg limited to breakdown of skin: Secondary | ICD-10-CM | POA: Diagnosis not present

## 2020-03-03 DIAGNOSIS — E1142 Type 2 diabetes mellitus with diabetic polyneuropathy: Secondary | ICD-10-CM | POA: Diagnosis not present

## 2020-03-03 DIAGNOSIS — Z7984 Long term (current) use of oral hypoglycemic drugs: Secondary | ICD-10-CM | POA: Diagnosis not present

## 2020-03-03 DIAGNOSIS — I482 Chronic atrial fibrillation, unspecified: Secondary | ICD-10-CM | POA: Diagnosis not present

## 2020-03-03 DIAGNOSIS — N183 Chronic kidney disease, stage 3 unspecified: Secondary | ICD-10-CM | POA: Diagnosis not present

## 2020-03-03 DIAGNOSIS — I87313 Chronic venous hypertension (idiopathic) with ulcer of bilateral lower extremity: Secondary | ICD-10-CM | POA: Diagnosis not present

## 2020-03-06 DIAGNOSIS — I482 Chronic atrial fibrillation, unspecified: Secondary | ICD-10-CM | POA: Diagnosis not present

## 2020-03-06 DIAGNOSIS — E1142 Type 2 diabetes mellitus with diabetic polyneuropathy: Secondary | ICD-10-CM | POA: Diagnosis not present

## 2020-03-06 DIAGNOSIS — L97821 Non-pressure chronic ulcer of other part of left lower leg limited to breakdown of skin: Secondary | ICD-10-CM | POA: Diagnosis not present

## 2020-03-06 DIAGNOSIS — E1151 Type 2 diabetes mellitus with diabetic peripheral angiopathy without gangrene: Secondary | ICD-10-CM | POA: Diagnosis not present

## 2020-03-06 DIAGNOSIS — I129 Hypertensive chronic kidney disease with stage 1 through stage 4 chronic kidney disease, or unspecified chronic kidney disease: Secondary | ICD-10-CM | POA: Diagnosis not present

## 2020-03-06 DIAGNOSIS — Z4800 Encounter for change or removal of nonsurgical wound dressing: Secondary | ICD-10-CM | POA: Diagnosis not present

## 2020-03-06 DIAGNOSIS — E1122 Type 2 diabetes mellitus with diabetic chronic kidney disease: Secondary | ICD-10-CM | POA: Diagnosis not present

## 2020-03-06 DIAGNOSIS — Z7984 Long term (current) use of oral hypoglycemic drugs: Secondary | ICD-10-CM | POA: Diagnosis not present

## 2020-03-06 DIAGNOSIS — N183 Chronic kidney disease, stage 3 unspecified: Secondary | ICD-10-CM | POA: Diagnosis not present

## 2020-03-06 DIAGNOSIS — I87313 Chronic venous hypertension (idiopathic) with ulcer of bilateral lower extremity: Secondary | ICD-10-CM | POA: Diagnosis not present

## 2020-03-06 DIAGNOSIS — L97811 Non-pressure chronic ulcer of other part of right lower leg limited to breakdown of skin: Secondary | ICD-10-CM | POA: Diagnosis not present

## 2020-03-10 DIAGNOSIS — Z7984 Long term (current) use of oral hypoglycemic drugs: Secondary | ICD-10-CM | POA: Diagnosis not present

## 2020-03-10 DIAGNOSIS — I10 Essential (primary) hypertension: Secondary | ICD-10-CM | POA: Diagnosis not present

## 2020-03-10 DIAGNOSIS — I482 Chronic atrial fibrillation, unspecified: Secondary | ICD-10-CM | POA: Diagnosis not present

## 2020-03-10 DIAGNOSIS — E1142 Type 2 diabetes mellitus with diabetic polyneuropathy: Secondary | ICD-10-CM | POA: Diagnosis not present

## 2020-03-10 DIAGNOSIS — E1165 Type 2 diabetes mellitus with hyperglycemia: Secondary | ICD-10-CM | POA: Diagnosis not present

## 2020-03-10 DIAGNOSIS — L97811 Non-pressure chronic ulcer of other part of right lower leg limited to breakdown of skin: Secondary | ICD-10-CM | POA: Diagnosis not present

## 2020-03-10 DIAGNOSIS — L97821 Non-pressure chronic ulcer of other part of left lower leg limited to breakdown of skin: Secondary | ICD-10-CM | POA: Diagnosis not present

## 2020-03-10 DIAGNOSIS — E1151 Type 2 diabetes mellitus with diabetic peripheral angiopathy without gangrene: Secondary | ICD-10-CM | POA: Diagnosis not present

## 2020-03-10 DIAGNOSIS — K2100 Gastro-esophageal reflux disease with esophagitis, without bleeding: Secondary | ICD-10-CM | POA: Diagnosis not present

## 2020-03-10 DIAGNOSIS — R946 Abnormal results of thyroid function studies: Secondary | ICD-10-CM | POA: Diagnosis not present

## 2020-03-10 DIAGNOSIS — N183 Chronic kidney disease, stage 3 unspecified: Secondary | ICD-10-CM | POA: Diagnosis not present

## 2020-03-10 DIAGNOSIS — I87313 Chronic venous hypertension (idiopathic) with ulcer of bilateral lower extremity: Secondary | ICD-10-CM | POA: Diagnosis not present

## 2020-03-10 DIAGNOSIS — I129 Hypertensive chronic kidney disease with stage 1 through stage 4 chronic kidney disease, or unspecified chronic kidney disease: Secondary | ICD-10-CM | POA: Diagnosis not present

## 2020-03-10 DIAGNOSIS — E1122 Type 2 diabetes mellitus with diabetic chronic kidney disease: Secondary | ICD-10-CM | POA: Diagnosis not present

## 2020-03-10 DIAGNOSIS — Z4800 Encounter for change or removal of nonsurgical wound dressing: Secondary | ICD-10-CM | POA: Diagnosis not present

## 2020-03-13 DIAGNOSIS — E11622 Type 2 diabetes mellitus with other skin ulcer: Secondary | ICD-10-CM | POA: Diagnosis not present

## 2020-03-13 DIAGNOSIS — I8392 Asymptomatic varicose veins of left lower extremity: Secondary | ICD-10-CM | POA: Diagnosis not present

## 2020-03-13 DIAGNOSIS — E7849 Other hyperlipidemia: Secondary | ICD-10-CM | POA: Diagnosis not present

## 2020-03-13 DIAGNOSIS — E538 Deficiency of other specified B group vitamins: Secondary | ICD-10-CM | POA: Diagnosis not present

## 2020-03-13 DIAGNOSIS — L97909 Non-pressure chronic ulcer of unspecified part of unspecified lower leg with unspecified severity: Secondary | ICD-10-CM | POA: Diagnosis not present

## 2020-03-13 DIAGNOSIS — I10 Essential (primary) hypertension: Secondary | ICD-10-CM | POA: Diagnosis not present

## 2020-03-13 DIAGNOSIS — E1142 Type 2 diabetes mellitus with diabetic polyneuropathy: Secondary | ICD-10-CM | POA: Diagnosis not present

## 2020-03-14 DIAGNOSIS — L97821 Non-pressure chronic ulcer of other part of left lower leg limited to breakdown of skin: Secondary | ICD-10-CM | POA: Diagnosis not present

## 2020-03-14 DIAGNOSIS — I129 Hypertensive chronic kidney disease with stage 1 through stage 4 chronic kidney disease, or unspecified chronic kidney disease: Secondary | ICD-10-CM | POA: Diagnosis not present

## 2020-03-14 DIAGNOSIS — N183 Chronic kidney disease, stage 3 unspecified: Secondary | ICD-10-CM | POA: Diagnosis not present

## 2020-03-14 DIAGNOSIS — I87313 Chronic venous hypertension (idiopathic) with ulcer of bilateral lower extremity: Secondary | ICD-10-CM | POA: Diagnosis not present

## 2020-03-14 DIAGNOSIS — Z4800 Encounter for change or removal of nonsurgical wound dressing: Secondary | ICD-10-CM | POA: Diagnosis not present

## 2020-03-14 DIAGNOSIS — E1151 Type 2 diabetes mellitus with diabetic peripheral angiopathy without gangrene: Secondary | ICD-10-CM | POA: Diagnosis not present

## 2020-03-14 DIAGNOSIS — I482 Chronic atrial fibrillation, unspecified: Secondary | ICD-10-CM | POA: Diagnosis not present

## 2020-03-14 DIAGNOSIS — Z7984 Long term (current) use of oral hypoglycemic drugs: Secondary | ICD-10-CM | POA: Diagnosis not present

## 2020-03-14 DIAGNOSIS — E1142 Type 2 diabetes mellitus with diabetic polyneuropathy: Secondary | ICD-10-CM | POA: Diagnosis not present

## 2020-03-14 DIAGNOSIS — E1122 Type 2 diabetes mellitus with diabetic chronic kidney disease: Secondary | ICD-10-CM | POA: Diagnosis not present

## 2020-03-14 DIAGNOSIS — L97811 Non-pressure chronic ulcer of other part of right lower leg limited to breakdown of skin: Secondary | ICD-10-CM | POA: Diagnosis not present

## 2020-03-17 ENCOUNTER — Other Ambulatory Visit: Payer: Self-pay

## 2020-03-18 DIAGNOSIS — E1122 Type 2 diabetes mellitus with diabetic chronic kidney disease: Secondary | ICD-10-CM | POA: Diagnosis not present

## 2020-03-18 DIAGNOSIS — E1142 Type 2 diabetes mellitus with diabetic polyneuropathy: Secondary | ICD-10-CM | POA: Diagnosis not present

## 2020-03-18 DIAGNOSIS — E1151 Type 2 diabetes mellitus with diabetic peripheral angiopathy without gangrene: Secondary | ICD-10-CM | POA: Diagnosis not present

## 2020-03-18 DIAGNOSIS — N183 Chronic kidney disease, stage 3 unspecified: Secondary | ICD-10-CM | POA: Diagnosis not present

## 2020-03-18 DIAGNOSIS — I87313 Chronic venous hypertension (idiopathic) with ulcer of bilateral lower extremity: Secondary | ICD-10-CM | POA: Diagnosis not present

## 2020-03-18 DIAGNOSIS — Z4800 Encounter for change or removal of nonsurgical wound dressing: Secondary | ICD-10-CM | POA: Diagnosis not present

## 2020-03-18 DIAGNOSIS — I482 Chronic atrial fibrillation, unspecified: Secondary | ICD-10-CM | POA: Diagnosis not present

## 2020-03-18 DIAGNOSIS — L97811 Non-pressure chronic ulcer of other part of right lower leg limited to breakdown of skin: Secondary | ICD-10-CM | POA: Diagnosis not present

## 2020-03-18 DIAGNOSIS — I129 Hypertensive chronic kidney disease with stage 1 through stage 4 chronic kidney disease, or unspecified chronic kidney disease: Secondary | ICD-10-CM | POA: Diagnosis not present

## 2020-03-18 DIAGNOSIS — L97821 Non-pressure chronic ulcer of other part of left lower leg limited to breakdown of skin: Secondary | ICD-10-CM | POA: Diagnosis not present

## 2020-03-18 DIAGNOSIS — Z7984 Long term (current) use of oral hypoglycemic drugs: Secondary | ICD-10-CM | POA: Diagnosis not present

## 2020-03-19 ENCOUNTER — Other Ambulatory Visit: Payer: Self-pay | Admitting: *Deleted

## 2020-03-19 DIAGNOSIS — M79606 Pain in leg, unspecified: Secondary | ICD-10-CM

## 2020-03-19 DIAGNOSIS — M7989 Other specified soft tissue disorders: Secondary | ICD-10-CM

## 2020-03-20 ENCOUNTER — Ambulatory Visit (INDEPENDENT_AMBULATORY_CARE_PROVIDER_SITE_OTHER): Payer: Medicare Other | Admitting: Physician Assistant

## 2020-03-20 ENCOUNTER — Ambulatory Visit (HOSPITAL_COMMUNITY)
Admission: RE | Admit: 2020-03-20 | Discharge: 2020-03-20 | Disposition: A | Discharge status: Home / Self Care | Payer: Medicare Other | Source: Ambulatory Visit | Attending: Vascular Surgery | Admitting: Vascular Surgery

## 2020-03-20 ENCOUNTER — Other Ambulatory Visit: Payer: Self-pay

## 2020-03-20 VITALS — BP 141/74 | HR 96 | Temp 97.8°F | Resp 20 | Ht 63.0 in | Wt 262.6 lb

## 2020-03-20 DIAGNOSIS — L97221 Non-pressure chronic ulcer of left calf limited to breakdown of skin: Secondary | ICD-10-CM | POA: Diagnosis not present

## 2020-03-20 DIAGNOSIS — M7989 Other specified soft tissue disorders: Secondary | ICD-10-CM | POA: Diagnosis not present

## 2020-03-20 DIAGNOSIS — M79606 Pain in leg, unspecified: Secondary | ICD-10-CM | POA: Diagnosis not present

## 2020-03-20 DIAGNOSIS — I872 Venous insufficiency (chronic) (peripheral): Secondary | ICD-10-CM | POA: Diagnosis not present

## 2020-03-20 NOTE — Progress Notes (Signed)
Requested by:  Richardean Chimera, MD [address] [place],  [place] [address]  Reason for consultation: venous stasis ulcers    History of Present Illness   Chelsea Bates is a 70 y.o. ([birth year]) female who presents for evaluation of bilateral lower extremity venous insufficiency. She has had bilateral LE venous ulcerations for several months and is getting routine wound care. She has chronic edema and has worn 20-30 mm Hg stockings for three months in the past. Currently, she is in compressive wraps due to her ulcers. She denies lower extremity pain.  Venous symptoms include: positive if (X) [  ] aching [  ] heavy [x  ] tired  [  ] throbbing [  ] burning  [  ] itching [ x ]swelling [  ] bleeding [ x ] ulcer Onset/duration:  Months/years  Occupation:  Unemployed/retired Aggravating factors: sitting Alleviating factors: compression Compression:  yes Helps:  yes Pain medications:  no Previous vein procedures:  no History of DVT:  no  Past Medical History:  Diagnosis Date  . Allergic rhinitis due to pollen   . Asymptomatic varicose veins of left lower extremity   . 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  Marland Kitchen GERD (gastroesophageal reflux disease)   . Hyperlipidemia   . Hypertension    Essential  . 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 Numbness and tingling of both feet   . Numbness and tingling of both legs   . Obesity   . Peripheral neuropathy     History reviewed. No pertinent surgical history.  Social History   Socioeconomic History  . Marital status: Unknown    Spouse name: Not on file  . Number of children: Not on file  . Years of education: Not on file  . Highest education level: Not on file  Occupational History  . Not on file  Tobacco Use  . Smoking status: Not on file  . Smokeless tobacco: Not on file   Substance and Sexual Activity  . Alcohol use: Not on file  . Drug use: Not on file  . Sexual activity: Not on file  Other Topics Concern  . Not on file  Social History Narrative  . Not on file   Social Determinants of Health   Financial Resource Strain: Not on file  Food Insecurity: Not on file  Transportation Needs: Not on file  Physical Activity: Not on file  Stress: Not on file  Social Connections: Not on file  Intimate Partner Violence: Not on file   No family history on file.  Current Outpatient Medications  Medication Sig Dispense Refill  . atorvastatin (LIPITOR) 10 MG tablet Take 10 mg by mouth daily.    . Calcium Carbonate-Vitamin D3 600-400 MG-UNIT TABS Take by mouth.    . diltiazem (CARDIZEM CD) 240 MG 24 hr capsule Take 240 mg by mouth daily.    . empagliflozin (JARDIANCE) 25 MG TABS tablet Take by mouth daily.    Marland Kitchen glipiZIDE (GLUCOTROL XL) 10 MG 24 hr tablet     . losartan-hydrochlorothiazide (HYZAAR) 100-25 MG tablet     . metFORMIN (GLUCOPHAGE-XR) 500 MG 24 hr tablet Take 500 mg by mouth 4 (four) times daily.    . metoprolol tartrate (LOPRESSOR) 50 MG tablet     . omeprazole (PRILOSEC) 20 MG capsule  No current facility-administered medications for this visit.    Not on File  REVIEW OF SYSTEMS (negative unless checked):   Cardiac:  []  Chest pain or chest pressure? []  Shortness of breath upon activity? []  Shortness of breath when lying flat? [x]  Irregular heart rhythm?  Vascular:  []  Pain in calf, thigh, or hip brought on by walking? []  Pain in feet at night that wakes you up from your sleep? []  Blood clot in your veins? [x]  Leg swelling?  Pulmonary:  []  Oxygen at home? []  Productive cough? []  Wheezing?  Neurologic:  []  Sudden weakness in arms or legs? []  Sudden numbness in arms or legs? []  Sudden onset of difficult speaking or slurred speech? []  Temporary loss of vision in one eye? []  Problems with dizziness?  Gastrointestinal:  []   Blood in stool? []  Vomited blood?  Genitourinary:  []  Burning when urinating? []  Blood in urine?  Psychiatric:  []  Major depression  Hematologic:  []  Bleeding problems? []  Problems with blood clotting?  Dermatologic:  []  Rashes or ulcers?  Constitutional:  []  Fever or chills?  Ear/Nose/Throat:  []  Change in hearing? []  Nose bleeds? []  Sore throat?  Musculoskeletal:  []  Back pain? []  Joint pain? []  Muscle pain?   Physical Examination     General:  WDWN in NAD; vital signs documented above Gait: Not observed HENT: WNL, normocephalic Pulmonary: normal non-labored breathing Cardiac: regular HR Skin: with rashes to both lower legs Extremities: without varicose veins, with reticular veins, with edema, with stasis pigmentation, with lipodermatosclerosis, with ulcers (se photos) Musculoskeletal: no muscle wasting or atrophy  Neurologic: A&O X 3;  No focal weakness or paresthesias are detected Psychiatric:  The pt has Normal affect.  LEFT LOWER LEG (laterally)   Right medial ankle     Non-invasive Vascular Imaging   BLE Venous Insufficiency Duplex (03/20/2020):   RLE:   negative DVT and SVT,   positive GSV reflux through mid-calf,  GSV diameter 0.50-0.73 cm  Positive accessory SSV reflux ,  positive deep venous reflux   LLE:  negative DVT and SVT,   positive GSV reflux at distal thigh,   GSV diameter 0.39-0.91 cm  positive SSV reflux at mid calf,  positive deep venous reflux   Medical Decision Making   Chelsea Bates is a 70 y.o. female who presents with: BLE chronic venous insufficiency and reflux with dilated greater saphenous vein on the left. No evidence of DVT. She has bilateral lower extremity venous stasis ulcers. She has palpable pedal pulses.   Based on the patient's history and examination, I recommend: elevation, compression wrap continued local wound care.  We discussed findings of reflux on her venous duplex study today and  will make arrangements for  follow up with Dr. or Dr. .  She is given written information regarding healthy vein care and encouraged to elevate legs daily above heart level.  Thank you for allowing to participate in this patient's care.   , PA-C [facility] Office: [phone]  03/20/2020, 12:10 PM  Clinic MD: 

## 2020-03-22 DIAGNOSIS — N183 Chronic kidney disease, stage 3 unspecified: Secondary | ICD-10-CM | POA: Diagnosis not present

## 2020-03-22 DIAGNOSIS — I482 Chronic atrial fibrillation, unspecified: Secondary | ICD-10-CM | POA: Diagnosis not present

## 2020-03-22 DIAGNOSIS — Z7984 Long term (current) use of oral hypoglycemic drugs: Secondary | ICD-10-CM | POA: Diagnosis not present

## 2020-03-22 DIAGNOSIS — I129 Hypertensive chronic kidney disease with stage 1 through stage 4 chronic kidney disease, or unspecified chronic kidney disease: Secondary | ICD-10-CM | POA: Diagnosis not present

## 2020-03-22 DIAGNOSIS — Z4800 Encounter for change or removal of nonsurgical wound dressing: Secondary | ICD-10-CM | POA: Diagnosis not present

## 2020-03-22 DIAGNOSIS — E1142 Type 2 diabetes mellitus with diabetic polyneuropathy: Secondary | ICD-10-CM | POA: Diagnosis not present

## 2020-03-22 DIAGNOSIS — L97821 Non-pressure chronic ulcer of other part of left lower leg limited to breakdown of skin: Secondary | ICD-10-CM | POA: Diagnosis not present

## 2020-03-22 DIAGNOSIS — E1122 Type 2 diabetes mellitus with diabetic chronic kidney disease: Secondary | ICD-10-CM | POA: Diagnosis not present

## 2020-03-22 DIAGNOSIS — I87313 Chronic venous hypertension (idiopathic) with ulcer of bilateral lower extremity: Secondary | ICD-10-CM | POA: Diagnosis not present

## 2020-03-22 DIAGNOSIS — L97811 Non-pressure chronic ulcer of other part of right lower leg limited to breakdown of skin: Secondary | ICD-10-CM | POA: Diagnosis not present

## 2020-03-22 DIAGNOSIS — E1151 Type 2 diabetes mellitus with diabetic peripheral angiopathy without gangrene: Secondary | ICD-10-CM | POA: Diagnosis not present

## 2020-03-25 DIAGNOSIS — E1151 Type 2 diabetes mellitus with diabetic peripheral angiopathy without gangrene: Secondary | ICD-10-CM | POA: Diagnosis not present

## 2020-03-25 DIAGNOSIS — L97811 Non-pressure chronic ulcer of other part of right lower leg limited to breakdown of skin: Secondary | ICD-10-CM | POA: Diagnosis not present

## 2020-03-25 DIAGNOSIS — I129 Hypertensive chronic kidney disease with stage 1 through stage 4 chronic kidney disease, or unspecified chronic kidney disease: Secondary | ICD-10-CM | POA: Diagnosis not present

## 2020-03-25 DIAGNOSIS — E1142 Type 2 diabetes mellitus with diabetic polyneuropathy: Secondary | ICD-10-CM | POA: Diagnosis not present

## 2020-03-25 DIAGNOSIS — N183 Chronic kidney disease, stage 3 unspecified: Secondary | ICD-10-CM | POA: Diagnosis not present

## 2020-03-25 DIAGNOSIS — Z7984 Long term (current) use of oral hypoglycemic drugs: Secondary | ICD-10-CM | POA: Diagnosis not present

## 2020-03-25 DIAGNOSIS — Z4800 Encounter for change or removal of nonsurgical wound dressing: Secondary | ICD-10-CM | POA: Diagnosis not present

## 2020-03-25 DIAGNOSIS — L97821 Non-pressure chronic ulcer of other part of left lower leg limited to breakdown of skin: Secondary | ICD-10-CM | POA: Diagnosis not present

## 2020-03-25 DIAGNOSIS — I87313 Chronic venous hypertension (idiopathic) with ulcer of bilateral lower extremity: Secondary | ICD-10-CM | POA: Diagnosis not present

## 2020-03-25 DIAGNOSIS — I482 Chronic atrial fibrillation, unspecified: Secondary | ICD-10-CM | POA: Diagnosis not present

## 2020-03-25 DIAGNOSIS — E1122 Type 2 diabetes mellitus with diabetic chronic kidney disease: Secondary | ICD-10-CM | POA: Diagnosis not present

## 2020-03-28 DIAGNOSIS — L97821 Non-pressure chronic ulcer of other part of left lower leg limited to breakdown of skin: Secondary | ICD-10-CM | POA: Diagnosis not present

## 2020-03-28 DIAGNOSIS — E1142 Type 2 diabetes mellitus with diabetic polyneuropathy: Secondary | ICD-10-CM | POA: Diagnosis not present

## 2020-03-28 DIAGNOSIS — E1151 Type 2 diabetes mellitus with diabetic peripheral angiopathy without gangrene: Secondary | ICD-10-CM | POA: Diagnosis not present

## 2020-03-28 DIAGNOSIS — Z7984 Long term (current) use of oral hypoglycemic drugs: Secondary | ICD-10-CM | POA: Diagnosis not present

## 2020-03-28 DIAGNOSIS — L97811 Non-pressure chronic ulcer of other part of right lower leg limited to breakdown of skin: Secondary | ICD-10-CM | POA: Diagnosis not present

## 2020-03-28 DIAGNOSIS — I129 Hypertensive chronic kidney disease with stage 1 through stage 4 chronic kidney disease, or unspecified chronic kidney disease: Secondary | ICD-10-CM | POA: Diagnosis not present

## 2020-03-28 DIAGNOSIS — I87313 Chronic venous hypertension (idiopathic) with ulcer of bilateral lower extremity: Secondary | ICD-10-CM | POA: Diagnosis not present

## 2020-03-28 DIAGNOSIS — Z4800 Encounter for change or removal of nonsurgical wound dressing: Secondary | ICD-10-CM | POA: Diagnosis not present

## 2020-03-28 DIAGNOSIS — N183 Chronic kidney disease, stage 3 unspecified: Secondary | ICD-10-CM | POA: Diagnosis not present

## 2020-03-28 DIAGNOSIS — I482 Chronic atrial fibrillation, unspecified: Secondary | ICD-10-CM | POA: Diagnosis not present

## 2020-03-28 DIAGNOSIS — E1122 Type 2 diabetes mellitus with diabetic chronic kidney disease: Secondary | ICD-10-CM | POA: Diagnosis not present

## 2020-04-02 DIAGNOSIS — H9202 Otalgia, left ear: Secondary | ICD-10-CM | POA: Diagnosis not present

## 2020-04-03 ENCOUNTER — Ambulatory Visit: Payer: Self-pay | Admitting: Nutrition

## 2020-04-03 DIAGNOSIS — Z7984 Long term (current) use of oral hypoglycemic drugs: Secondary | ICD-10-CM | POA: Diagnosis not present

## 2020-04-03 DIAGNOSIS — E1122 Type 2 diabetes mellitus with diabetic chronic kidney disease: Secondary | ICD-10-CM | POA: Diagnosis not present

## 2020-04-03 DIAGNOSIS — E1151 Type 2 diabetes mellitus with diabetic peripheral angiopathy without gangrene: Secondary | ICD-10-CM | POA: Diagnosis not present

## 2020-04-03 DIAGNOSIS — N183 Chronic kidney disease, stage 3 unspecified: Secondary | ICD-10-CM | POA: Diagnosis not present

## 2020-04-03 DIAGNOSIS — L97811 Non-pressure chronic ulcer of other part of right lower leg limited to breakdown of skin: Secondary | ICD-10-CM | POA: Diagnosis not present

## 2020-04-03 DIAGNOSIS — L97821 Non-pressure chronic ulcer of other part of left lower leg limited to breakdown of skin: Secondary | ICD-10-CM | POA: Diagnosis not present

## 2020-04-03 DIAGNOSIS — I87313 Chronic venous hypertension (idiopathic) with ulcer of bilateral lower extremity: Secondary | ICD-10-CM | POA: Diagnosis not present

## 2020-04-03 DIAGNOSIS — Z4800 Encounter for change or removal of nonsurgical wound dressing: Secondary | ICD-10-CM | POA: Diagnosis not present

## 2020-04-03 DIAGNOSIS — I482 Chronic atrial fibrillation, unspecified: Secondary | ICD-10-CM | POA: Diagnosis not present

## 2020-04-03 DIAGNOSIS — I129 Hypertensive chronic kidney disease with stage 1 through stage 4 chronic kidney disease, or unspecified chronic kidney disease: Secondary | ICD-10-CM | POA: Diagnosis not present

## 2020-04-03 DIAGNOSIS — E1142 Type 2 diabetes mellitus with diabetic polyneuropathy: Secondary | ICD-10-CM | POA: Diagnosis not present

## 2020-04-05 DIAGNOSIS — I87313 Chronic venous hypertension (idiopathic) with ulcer of bilateral lower extremity: Secondary | ICD-10-CM | POA: Diagnosis not present

## 2020-04-05 DIAGNOSIS — N183 Chronic kidney disease, stage 3 unspecified: Secondary | ICD-10-CM | POA: Diagnosis not present

## 2020-04-05 DIAGNOSIS — E1142 Type 2 diabetes mellitus with diabetic polyneuropathy: Secondary | ICD-10-CM | POA: Diagnosis not present

## 2020-04-05 DIAGNOSIS — Z7984 Long term (current) use of oral hypoglycemic drugs: Secondary | ICD-10-CM | POA: Diagnosis not present

## 2020-04-05 DIAGNOSIS — I129 Hypertensive chronic kidney disease with stage 1 through stage 4 chronic kidney disease, or unspecified chronic kidney disease: Secondary | ICD-10-CM | POA: Diagnosis not present

## 2020-04-05 DIAGNOSIS — Z4800 Encounter for change or removal of nonsurgical wound dressing: Secondary | ICD-10-CM | POA: Diagnosis not present

## 2020-04-05 DIAGNOSIS — L97811 Non-pressure chronic ulcer of other part of right lower leg limited to breakdown of skin: Secondary | ICD-10-CM | POA: Diagnosis not present

## 2020-04-05 DIAGNOSIS — E1151 Type 2 diabetes mellitus with diabetic peripheral angiopathy without gangrene: Secondary | ICD-10-CM | POA: Diagnosis not present

## 2020-04-05 DIAGNOSIS — I482 Chronic atrial fibrillation, unspecified: Secondary | ICD-10-CM | POA: Diagnosis not present

## 2020-04-05 DIAGNOSIS — L97821 Non-pressure chronic ulcer of other part of left lower leg limited to breakdown of skin: Secondary | ICD-10-CM | POA: Diagnosis not present

## 2020-04-05 DIAGNOSIS — E1122 Type 2 diabetes mellitus with diabetic chronic kidney disease: Secondary | ICD-10-CM | POA: Diagnosis not present

## 2020-04-07 DIAGNOSIS — E1142 Type 2 diabetes mellitus with diabetic polyneuropathy: Secondary | ICD-10-CM | POA: Diagnosis not present

## 2020-04-07 DIAGNOSIS — Z4800 Encounter for change or removal of nonsurgical wound dressing: Secondary | ICD-10-CM | POA: Diagnosis not present

## 2020-04-07 DIAGNOSIS — N183 Chronic kidney disease, stage 3 unspecified: Secondary | ICD-10-CM | POA: Diagnosis not present

## 2020-04-07 DIAGNOSIS — Z7984 Long term (current) use of oral hypoglycemic drugs: Secondary | ICD-10-CM | POA: Diagnosis not present

## 2020-04-07 DIAGNOSIS — I482 Chronic atrial fibrillation, unspecified: Secondary | ICD-10-CM | POA: Diagnosis not present

## 2020-04-07 DIAGNOSIS — L97811 Non-pressure chronic ulcer of other part of right lower leg limited to breakdown of skin: Secondary | ICD-10-CM | POA: Diagnosis not present

## 2020-04-07 DIAGNOSIS — E1151 Type 2 diabetes mellitus with diabetic peripheral angiopathy without gangrene: Secondary | ICD-10-CM | POA: Diagnosis not present

## 2020-04-07 DIAGNOSIS — I129 Hypertensive chronic kidney disease with stage 1 through stage 4 chronic kidney disease, or unspecified chronic kidney disease: Secondary | ICD-10-CM | POA: Diagnosis not present

## 2020-04-07 DIAGNOSIS — E1122 Type 2 diabetes mellitus with diabetic chronic kidney disease: Secondary | ICD-10-CM | POA: Diagnosis not present

## 2020-04-07 DIAGNOSIS — I87313 Chronic venous hypertension (idiopathic) with ulcer of bilateral lower extremity: Secondary | ICD-10-CM | POA: Diagnosis not present

## 2020-04-07 DIAGNOSIS — L97821 Non-pressure chronic ulcer of other part of left lower leg limited to breakdown of skin: Secondary | ICD-10-CM | POA: Diagnosis not present

## 2020-04-09 DIAGNOSIS — E1142 Type 2 diabetes mellitus with diabetic polyneuropathy: Secondary | ICD-10-CM | POA: Diagnosis not present

## 2020-04-09 DIAGNOSIS — I482 Chronic atrial fibrillation, unspecified: Secondary | ICD-10-CM | POA: Diagnosis not present

## 2020-04-09 DIAGNOSIS — E1151 Type 2 diabetes mellitus with diabetic peripheral angiopathy without gangrene: Secondary | ICD-10-CM | POA: Diagnosis not present

## 2020-04-09 DIAGNOSIS — Z4800 Encounter for change or removal of nonsurgical wound dressing: Secondary | ICD-10-CM | POA: Diagnosis not present

## 2020-04-09 DIAGNOSIS — I87313 Chronic venous hypertension (idiopathic) with ulcer of bilateral lower extremity: Secondary | ICD-10-CM | POA: Diagnosis not present

## 2020-04-09 DIAGNOSIS — I129 Hypertensive chronic kidney disease with stage 1 through stage 4 chronic kidney disease, or unspecified chronic kidney disease: Secondary | ICD-10-CM | POA: Diagnosis not present

## 2020-04-09 DIAGNOSIS — Z7984 Long term (current) use of oral hypoglycemic drugs: Secondary | ICD-10-CM | POA: Diagnosis not present

## 2020-04-09 DIAGNOSIS — L97821 Non-pressure chronic ulcer of other part of left lower leg limited to breakdown of skin: Secondary | ICD-10-CM | POA: Diagnosis not present

## 2020-04-09 DIAGNOSIS — N183 Chronic kidney disease, stage 3 unspecified: Secondary | ICD-10-CM | POA: Diagnosis not present

## 2020-04-09 DIAGNOSIS — L97811 Non-pressure chronic ulcer of other part of right lower leg limited to breakdown of skin: Secondary | ICD-10-CM | POA: Diagnosis not present

## 2020-04-09 DIAGNOSIS — E1122 Type 2 diabetes mellitus with diabetic chronic kidney disease: Secondary | ICD-10-CM | POA: Diagnosis not present

## 2020-04-11 DIAGNOSIS — L97821 Non-pressure chronic ulcer of other part of left lower leg limited to breakdown of skin: Secondary | ICD-10-CM | POA: Diagnosis not present

## 2020-04-11 DIAGNOSIS — L97811 Non-pressure chronic ulcer of other part of right lower leg limited to breakdown of skin: Secondary | ICD-10-CM | POA: Diagnosis not present

## 2020-04-11 DIAGNOSIS — E1151 Type 2 diabetes mellitus with diabetic peripheral angiopathy without gangrene: Secondary | ICD-10-CM | POA: Diagnosis not present

## 2020-04-11 DIAGNOSIS — Z7984 Long term (current) use of oral hypoglycemic drugs: Secondary | ICD-10-CM | POA: Diagnosis not present

## 2020-04-11 DIAGNOSIS — Z4800 Encounter for change or removal of nonsurgical wound dressing: Secondary | ICD-10-CM | POA: Diagnosis not present

## 2020-04-11 DIAGNOSIS — E1142 Type 2 diabetes mellitus with diabetic polyneuropathy: Secondary | ICD-10-CM | POA: Diagnosis not present

## 2020-04-11 DIAGNOSIS — I482 Chronic atrial fibrillation, unspecified: Secondary | ICD-10-CM | POA: Diagnosis not present

## 2020-04-11 DIAGNOSIS — I87313 Chronic venous hypertension (idiopathic) with ulcer of bilateral lower extremity: Secondary | ICD-10-CM | POA: Diagnosis not present

## 2020-04-11 DIAGNOSIS — N183 Chronic kidney disease, stage 3 unspecified: Secondary | ICD-10-CM | POA: Diagnosis not present

## 2020-04-11 DIAGNOSIS — I129 Hypertensive chronic kidney disease with stage 1 through stage 4 chronic kidney disease, or unspecified chronic kidney disease: Secondary | ICD-10-CM | POA: Diagnosis not present

## 2020-04-11 DIAGNOSIS — E1122 Type 2 diabetes mellitus with diabetic chronic kidney disease: Secondary | ICD-10-CM | POA: Diagnosis not present

## 2020-04-14 DIAGNOSIS — E1151 Type 2 diabetes mellitus with diabetic peripheral angiopathy without gangrene: Secondary | ICD-10-CM | POA: Diagnosis not present

## 2020-04-14 DIAGNOSIS — E1122 Type 2 diabetes mellitus with diabetic chronic kidney disease: Secondary | ICD-10-CM | POA: Diagnosis not present

## 2020-04-14 DIAGNOSIS — L97811 Non-pressure chronic ulcer of other part of right lower leg limited to breakdown of skin: Secondary | ICD-10-CM | POA: Diagnosis not present

## 2020-04-14 DIAGNOSIS — E1142 Type 2 diabetes mellitus with diabetic polyneuropathy: Secondary | ICD-10-CM | POA: Diagnosis not present

## 2020-04-14 DIAGNOSIS — L97821 Non-pressure chronic ulcer of other part of left lower leg limited to breakdown of skin: Secondary | ICD-10-CM | POA: Diagnosis not present

## 2020-04-14 DIAGNOSIS — Z7984 Long term (current) use of oral hypoglycemic drugs: Secondary | ICD-10-CM | POA: Diagnosis not present

## 2020-04-14 DIAGNOSIS — I87313 Chronic venous hypertension (idiopathic) with ulcer of bilateral lower extremity: Secondary | ICD-10-CM | POA: Diagnosis not present

## 2020-04-14 DIAGNOSIS — N183 Chronic kidney disease, stage 3 unspecified: Secondary | ICD-10-CM | POA: Diagnosis not present

## 2020-04-14 DIAGNOSIS — I482 Chronic atrial fibrillation, unspecified: Secondary | ICD-10-CM | POA: Diagnosis not present

## 2020-04-14 DIAGNOSIS — I129 Hypertensive chronic kidney disease with stage 1 through stage 4 chronic kidney disease, or unspecified chronic kidney disease: Secondary | ICD-10-CM | POA: Diagnosis not present

## 2020-04-14 DIAGNOSIS — Z4800 Encounter for change or removal of nonsurgical wound dressing: Secondary | ICD-10-CM | POA: Diagnosis not present

## 2020-04-16 DIAGNOSIS — N183 Chronic kidney disease, stage 3 unspecified: Secondary | ICD-10-CM | POA: Diagnosis not present

## 2020-04-16 DIAGNOSIS — E1122 Type 2 diabetes mellitus with diabetic chronic kidney disease: Secondary | ICD-10-CM | POA: Diagnosis not present

## 2020-04-16 DIAGNOSIS — I129 Hypertensive chronic kidney disease with stage 1 through stage 4 chronic kidney disease, or unspecified chronic kidney disease: Secondary | ICD-10-CM | POA: Diagnosis not present

## 2020-04-16 DIAGNOSIS — L97811 Non-pressure chronic ulcer of other part of right lower leg limited to breakdown of skin: Secondary | ICD-10-CM | POA: Diagnosis not present

## 2020-04-16 DIAGNOSIS — E1151 Type 2 diabetes mellitus with diabetic peripheral angiopathy without gangrene: Secondary | ICD-10-CM | POA: Diagnosis not present

## 2020-04-16 DIAGNOSIS — I87313 Chronic venous hypertension (idiopathic) with ulcer of bilateral lower extremity: Secondary | ICD-10-CM | POA: Diagnosis not present

## 2020-04-16 DIAGNOSIS — I482 Chronic atrial fibrillation, unspecified: Secondary | ICD-10-CM | POA: Diagnosis not present

## 2020-04-16 DIAGNOSIS — Z4800 Encounter for change or removal of nonsurgical wound dressing: Secondary | ICD-10-CM | POA: Diagnosis not present

## 2020-04-16 DIAGNOSIS — L97821 Non-pressure chronic ulcer of other part of left lower leg limited to breakdown of skin: Secondary | ICD-10-CM | POA: Diagnosis not present

## 2020-04-16 DIAGNOSIS — Z7984 Long term (current) use of oral hypoglycemic drugs: Secondary | ICD-10-CM | POA: Diagnosis not present

## 2020-04-16 DIAGNOSIS — E1142 Type 2 diabetes mellitus with diabetic polyneuropathy: Secondary | ICD-10-CM | POA: Diagnosis not present

## 2020-04-17 ENCOUNTER — Encounter: Payer: Self-pay | Admitting: Vascular Surgery

## 2020-04-17 ENCOUNTER — Ambulatory Visit (INDEPENDENT_AMBULATORY_CARE_PROVIDER_SITE_OTHER): Payer: Medicare Other | Admitting: Vascular Surgery

## 2020-04-17 ENCOUNTER — Other Ambulatory Visit: Payer: Self-pay

## 2020-04-17 VITALS — BP 162/79 | HR 72 | Temp 97.6°F | Resp 20 | Ht 63.0 in | Wt 264.0 lb

## 2020-04-17 DIAGNOSIS — I83813 Varicose veins of bilateral lower extremities with pain: Secondary | ICD-10-CM

## 2020-04-17 NOTE — Progress Notes (Signed)
Patient name: Chelsea Bates MRN: [id number] DOB: 1950/04/01 Sex: female  REASON FOR CONSULT: Chronic venous ulceration with superficial and deep venous reflux HPI: Chelsea Bates is a 70 y.o. female, with an 65-month history of ulcerations on the left lateral malleolus and right medial malleolus.  She is currently receiving home health wound care 3 days a week for this.  Patient states she did have an ulceration on her right leg in the past but this was about 25 years ago and it healed fairly rapidly.  She has not worn compression stockings in the past after a brief trial where she was irritated by the stockings falling down.  She realizes that she has a problem with her weight and she is trying to get an appointment with a nutritionist in the near future.  We did discuss exercise and weight loss as a component improving of her underlying vein problem.  She has no prior history of DVT.  Other medical problems include diabetes, hyperlipidemia, hypertension, peripheral neuropathy, obesity all of which have been stable.  Past Medical History:  Diagnosis Date  . Allergic rhinitis due to pollen   . Asymptomatic varicose veins of left lower extremity   . 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  Marland Kitchen GERD (gastroesophageal reflux disease)   . Hyperlipidemia   . Hypertension    Essential  . 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 Numbness and tingling of both feet   . Numbness and tingling of both legs   . Obesity   . Peripheral neuropathy    History reviewed. No pertinent surgical history.  Family History  Problem Relation Age of Onset  . Heart disease Mother     SOCIAL HISTORY: Social History   Socioeconomic History  . Marital status: Unknown    Spouse name: Not on file  . Number of children: Not on file  . Years of education: Not on  file  . Highest education level: Not on file  Occupational History  . Not on file  Tobacco Use  . Smoking status: Former Smoker    Quit date: 1988    Years since quitting: 34.2  . Smokeless tobacco: Never Used  Vaping Use  . Vaping Use: Never used  Substance and Sexual Activity  . Alcohol use: Never  . Drug use: Never  . Sexual activity: Not on file  Other Topics Concern  . Not on file  Social History Narrative  . Not on file   Social Determinants of Health   Financial Resource Strain: Not on file  Food Insecurity: Not on file  Transportation Needs: Not on file  Physical Activity: Not on file  Stress: Not on file  Social Connections: Not on file  Intimate Partner Violence: Not on file    No Known Allergies  Current Outpatient Medications  Medication Sig Dispense Refill  . atorvastatin (LIPITOR) 10 MG tablet Take 10 mg by mouth daily.    . Calcium Carbonate-Vitamin D3 600-400 MG-UNIT TABS Take by mouth.    . diltiazem (CARDIZEM CD) 240 MG 24 hr capsule Take 240 mg by mouth daily.    . empagliflozin (JARDIANCE) 25 MG TABS tablet Take by mouth daily.    Marland Kitchen glipiZIDE (GLUCOTROL XL) 10 MG 24 hr tablet     . losartan-hydrochlorothiazide (HYZAAR) 100-25 MG tablet     . metFORMIN (GLUCOPHAGE-XR)  500 MG 24 hr tablet Take 500 mg by mouth 4 (four) times daily.    . metoprolol tartrate (LOPRESSOR) 50 MG tablet     . omeprazole (PRILOSEC) 20 MG capsule      No current facility-administered medications for this visit.    ROS:   General:  No weight loss, Fever, chills  HEENT: No recent headaches, no nasal bleeding, no visual changes, no sore throat  Neurologic: No dizziness, blackouts, seizures. No recent symptoms of stroke or mini- stroke. No recent episodes of slurred speech, or temporary blindness.  Cardiac: No recent episodes of chest pain/pressure, no shortness of breath at rest.  No shortness of breath with exertion.  Denies history of atrial fibrillation or irregular  heartbeat  Vascular: No history of rest pain in feet.  No history of claudication.  No history of non-healing ulcer, No history of DVT   Pulmonary: No home oxygen, no productive cough, no hemoptysis,  No asthma or wheezing  Musculoskeletal:  [X]  Arthritis, [X]  Low back pain,  [X]  Joint pain  Hematologic:No history of hypercoagulable state.  No history of easy bleeding.  No history of anemia  Gastrointestinal: No hematochezia or melena,  No gastroesophageal reflux, no trouble swallowing  Urinary: [ ]  chronic Kidney disease, [ ]  on HD - [ ]  MWF or [ ]  TTHS, [ ]  Burning with urination, [ ]  Frequent urination, [ ]  Difficulty urinating;   Skin: No rashes  Psychological: No history of anxiety,  No history of depression   Physical Examination  Vitals:   04/17/20 1000  BP: (!) 162/79  Pulse: 72  Resp: 20  Temp: 97.6 F (36.4 C)  SpO2: 95%  Weight: 264 lb (119.7 kg)  Height: 5\' 3"  (1.6 m)    Body mass index is 46.77 kg/m.  General:  Alert and oriented, no acute distress HEENT: Normal Neck: No JVD Cardiac: Regular Rate and Rhythm Abdomen: Soft, non-tender, non-distended, no mass, obese Skin: No rash, right medial malleolus and left lateral malleolus ulcers similar to previous images taken by my PA at her recent office visit. Extremity Pulses:  2+ radial, brachial, femoral, dorsalis pedis, posterior tibial pulses bilaterally Musculoskeletal: No deformity or edema  Neurologic: Upper and lower extremity motor 5/5 and symmetric  DATA:  Patient had a venous reflux exam on March 20, 2020.  This showed diffuse reflux in the right greater saphenous vein with a 5 to 9 mm diameter.  There was some tortuosity in the distal thigh.  On the left side diameter was 5 to 9 mm.  There was no DVT.  He had common femoral vein reflux bilaterally.  She also had reflux in the popliteal vein on the left.  I repeated portions of her venous exam today with the SonoSite at the bedside.  This  shows a fairly uniform diameter right greater saphenous vein is 7 mm and left of 5 mm  ASSESSMENT: Bilateral chronic venous stasis ulcers which are slowly healing.  Patient has evidence of deep and superficial venous reflux bilaterally.  Lengthy discussion with patient today about pathophysiology of venous disease.  We also discussed that a component of healing these ulcers is going to be weight loss and long-term compression therapy.  We also discussed that laser ablation is 1 component of this and would not be completely curative but assist with wound healing.  Risk benefits possible complications of procedure details including not limited to bleeding infection deep vein thrombosis were discussed with the patient and her  friend who was with her for the office visit today.   PLAN: Left followed by right saphenous vein laser ablation to be scheduled in the near future pending insurance approval.   Fabienne Bruns, MD [facility] Office: [phone]

## 2020-04-18 DIAGNOSIS — L97821 Non-pressure chronic ulcer of other part of left lower leg limited to breakdown of skin: Secondary | ICD-10-CM | POA: Diagnosis not present

## 2020-04-18 DIAGNOSIS — Z7984 Long term (current) use of oral hypoglycemic drugs: Secondary | ICD-10-CM | POA: Diagnosis not present

## 2020-04-18 DIAGNOSIS — E1142 Type 2 diabetes mellitus with diabetic polyneuropathy: Secondary | ICD-10-CM | POA: Diagnosis not present

## 2020-04-18 DIAGNOSIS — I87313 Chronic venous hypertension (idiopathic) with ulcer of bilateral lower extremity: Secondary | ICD-10-CM | POA: Diagnosis not present

## 2020-04-18 DIAGNOSIS — E1151 Type 2 diabetes mellitus with diabetic peripheral angiopathy without gangrene: Secondary | ICD-10-CM | POA: Diagnosis not present

## 2020-04-18 DIAGNOSIS — L97811 Non-pressure chronic ulcer of other part of right lower leg limited to breakdown of skin: Secondary | ICD-10-CM | POA: Diagnosis not present

## 2020-04-18 DIAGNOSIS — N183 Chronic kidney disease, stage 3 unspecified: Secondary | ICD-10-CM | POA: Diagnosis not present

## 2020-04-18 DIAGNOSIS — E1122 Type 2 diabetes mellitus with diabetic chronic kidney disease: Secondary | ICD-10-CM | POA: Diagnosis not present

## 2020-04-18 DIAGNOSIS — I129 Hypertensive chronic kidney disease with stage 1 through stage 4 chronic kidney disease, or unspecified chronic kidney disease: Secondary | ICD-10-CM | POA: Diagnosis not present

## 2020-04-18 DIAGNOSIS — I482 Chronic atrial fibrillation, unspecified: Secondary | ICD-10-CM | POA: Diagnosis not present

## 2020-04-18 DIAGNOSIS — Z4800 Encounter for change or removal of nonsurgical wound dressing: Secondary | ICD-10-CM | POA: Diagnosis not present

## 2020-04-21 DIAGNOSIS — I129 Hypertensive chronic kidney disease with stage 1 through stage 4 chronic kidney disease, or unspecified chronic kidney disease: Secondary | ICD-10-CM | POA: Diagnosis not present

## 2020-04-21 DIAGNOSIS — E1142 Type 2 diabetes mellitus with diabetic polyneuropathy: Secondary | ICD-10-CM | POA: Diagnosis not present

## 2020-04-21 DIAGNOSIS — Z4800 Encounter for change or removal of nonsurgical wound dressing: Secondary | ICD-10-CM | POA: Diagnosis not present

## 2020-04-21 DIAGNOSIS — E1151 Type 2 diabetes mellitus with diabetic peripheral angiopathy without gangrene: Secondary | ICD-10-CM | POA: Diagnosis not present

## 2020-04-21 DIAGNOSIS — L97821 Non-pressure chronic ulcer of other part of left lower leg limited to breakdown of skin: Secondary | ICD-10-CM | POA: Diagnosis not present

## 2020-04-21 DIAGNOSIS — E1122 Type 2 diabetes mellitus with diabetic chronic kidney disease: Secondary | ICD-10-CM | POA: Diagnosis not present

## 2020-04-21 DIAGNOSIS — L97811 Non-pressure chronic ulcer of other part of right lower leg limited to breakdown of skin: Secondary | ICD-10-CM | POA: Diagnosis not present

## 2020-04-21 DIAGNOSIS — N183 Chronic kidney disease, stage 3 unspecified: Secondary | ICD-10-CM | POA: Diagnosis not present

## 2020-04-21 DIAGNOSIS — I482 Chronic atrial fibrillation, unspecified: Secondary | ICD-10-CM | POA: Diagnosis not present

## 2020-04-21 DIAGNOSIS — I87313 Chronic venous hypertension (idiopathic) with ulcer of bilateral lower extremity: Secondary | ICD-10-CM | POA: Diagnosis not present

## 2020-04-21 DIAGNOSIS — Z7984 Long term (current) use of oral hypoglycemic drugs: Secondary | ICD-10-CM | POA: Diagnosis not present

## 2020-04-22 DIAGNOSIS — J329 Chronic sinusitis, unspecified: Secondary | ICD-10-CM | POA: Diagnosis not present

## 2020-04-22 DIAGNOSIS — R609 Edema, unspecified: Secondary | ICD-10-CM | POA: Diagnosis not present

## 2020-04-22 DIAGNOSIS — R059 Cough, unspecified: Secondary | ICD-10-CM | POA: Diagnosis not present

## 2020-04-23 DIAGNOSIS — E1122 Type 2 diabetes mellitus with diabetic chronic kidney disease: Secondary | ICD-10-CM | POA: Diagnosis not present

## 2020-04-23 DIAGNOSIS — L97821 Non-pressure chronic ulcer of other part of left lower leg limited to breakdown of skin: Secondary | ICD-10-CM | POA: Diagnosis not present

## 2020-04-23 DIAGNOSIS — I482 Chronic atrial fibrillation, unspecified: Secondary | ICD-10-CM | POA: Diagnosis not present

## 2020-04-23 DIAGNOSIS — N183 Chronic kidney disease, stage 3 unspecified: Secondary | ICD-10-CM | POA: Diagnosis not present

## 2020-04-23 DIAGNOSIS — L97811 Non-pressure chronic ulcer of other part of right lower leg limited to breakdown of skin: Secondary | ICD-10-CM | POA: Diagnosis not present

## 2020-04-23 DIAGNOSIS — E1142 Type 2 diabetes mellitus with diabetic polyneuropathy: Secondary | ICD-10-CM | POA: Diagnosis not present

## 2020-04-23 DIAGNOSIS — I87313 Chronic venous hypertension (idiopathic) with ulcer of bilateral lower extremity: Secondary | ICD-10-CM | POA: Diagnosis not present

## 2020-04-23 DIAGNOSIS — E1151 Type 2 diabetes mellitus with diabetic peripheral angiopathy without gangrene: Secondary | ICD-10-CM | POA: Diagnosis not present

## 2020-04-23 DIAGNOSIS — Z7984 Long term (current) use of oral hypoglycemic drugs: Secondary | ICD-10-CM | POA: Diagnosis not present

## 2020-04-23 DIAGNOSIS — I129 Hypertensive chronic kidney disease with stage 1 through stage 4 chronic kidney disease, or unspecified chronic kidney disease: Secondary | ICD-10-CM | POA: Diagnosis not present

## 2020-04-24 ENCOUNTER — Other Ambulatory Visit: Payer: Self-pay | Admitting: *Deleted

## 2020-04-24 DIAGNOSIS — I83813 Varicose veins of bilateral lower extremities with pain: Secondary | ICD-10-CM

## 2020-04-25 DIAGNOSIS — L97811 Non-pressure chronic ulcer of other part of right lower leg limited to breakdown of skin: Secondary | ICD-10-CM | POA: Diagnosis not present

## 2020-04-25 DIAGNOSIS — E1151 Type 2 diabetes mellitus with diabetic peripheral angiopathy without gangrene: Secondary | ICD-10-CM | POA: Diagnosis not present

## 2020-04-25 DIAGNOSIS — N183 Chronic kidney disease, stage 3 unspecified: Secondary | ICD-10-CM | POA: Diagnosis not present

## 2020-04-25 DIAGNOSIS — I129 Hypertensive chronic kidney disease with stage 1 through stage 4 chronic kidney disease, or unspecified chronic kidney disease: Secondary | ICD-10-CM | POA: Diagnosis not present

## 2020-04-25 DIAGNOSIS — E1142 Type 2 diabetes mellitus with diabetic polyneuropathy: Secondary | ICD-10-CM | POA: Diagnosis not present

## 2020-04-25 DIAGNOSIS — L97821 Non-pressure chronic ulcer of other part of left lower leg limited to breakdown of skin: Secondary | ICD-10-CM | POA: Diagnosis not present

## 2020-04-25 DIAGNOSIS — Z7984 Long term (current) use of oral hypoglycemic drugs: Secondary | ICD-10-CM | POA: Diagnosis not present

## 2020-04-25 DIAGNOSIS — I482 Chronic atrial fibrillation, unspecified: Secondary | ICD-10-CM | POA: Diagnosis not present

## 2020-04-25 DIAGNOSIS — E1122 Type 2 diabetes mellitus with diabetic chronic kidney disease: Secondary | ICD-10-CM | POA: Diagnosis not present

## 2020-04-25 DIAGNOSIS — I87313 Chronic venous hypertension (idiopathic) with ulcer of bilateral lower extremity: Secondary | ICD-10-CM | POA: Diagnosis not present

## 2020-04-28 DIAGNOSIS — L97821 Non-pressure chronic ulcer of other part of left lower leg limited to breakdown of skin: Secondary | ICD-10-CM | POA: Diagnosis not present

## 2020-04-28 DIAGNOSIS — I129 Hypertensive chronic kidney disease with stage 1 through stage 4 chronic kidney disease, or unspecified chronic kidney disease: Secondary | ICD-10-CM | POA: Diagnosis not present

## 2020-04-28 DIAGNOSIS — L97811 Non-pressure chronic ulcer of other part of right lower leg limited to breakdown of skin: Secondary | ICD-10-CM | POA: Diagnosis not present

## 2020-04-28 DIAGNOSIS — Z7984 Long term (current) use of oral hypoglycemic drugs: Secondary | ICD-10-CM | POA: Diagnosis not present

## 2020-04-28 DIAGNOSIS — I482 Chronic atrial fibrillation, unspecified: Secondary | ICD-10-CM | POA: Diagnosis not present

## 2020-04-28 DIAGNOSIS — E1122 Type 2 diabetes mellitus with diabetic chronic kidney disease: Secondary | ICD-10-CM | POA: Diagnosis not present

## 2020-04-28 DIAGNOSIS — E1142 Type 2 diabetes mellitus with diabetic polyneuropathy: Secondary | ICD-10-CM | POA: Diagnosis not present

## 2020-04-28 DIAGNOSIS — I87313 Chronic venous hypertension (idiopathic) with ulcer of bilateral lower extremity: Secondary | ICD-10-CM | POA: Diagnosis not present

## 2020-04-28 DIAGNOSIS — E1151 Type 2 diabetes mellitus with diabetic peripheral angiopathy without gangrene: Secondary | ICD-10-CM | POA: Diagnosis not present

## 2020-04-28 DIAGNOSIS — N183 Chronic kidney disease, stage 3 unspecified: Secondary | ICD-10-CM | POA: Diagnosis not present

## 2020-04-30 DIAGNOSIS — I129 Hypertensive chronic kidney disease with stage 1 through stage 4 chronic kidney disease, or unspecified chronic kidney disease: Secondary | ICD-10-CM | POA: Diagnosis not present

## 2020-04-30 DIAGNOSIS — L97821 Non-pressure chronic ulcer of other part of left lower leg limited to breakdown of skin: Secondary | ICD-10-CM | POA: Diagnosis not present

## 2020-04-30 DIAGNOSIS — E1151 Type 2 diabetes mellitus with diabetic peripheral angiopathy without gangrene: Secondary | ICD-10-CM | POA: Diagnosis not present

## 2020-04-30 DIAGNOSIS — N183 Chronic kidney disease, stage 3 unspecified: Secondary | ICD-10-CM | POA: Diagnosis not present

## 2020-04-30 DIAGNOSIS — E1142 Type 2 diabetes mellitus with diabetic polyneuropathy: Secondary | ICD-10-CM | POA: Diagnosis not present

## 2020-04-30 DIAGNOSIS — Z7984 Long term (current) use of oral hypoglycemic drugs: Secondary | ICD-10-CM | POA: Diagnosis not present

## 2020-04-30 DIAGNOSIS — I482 Chronic atrial fibrillation, unspecified: Secondary | ICD-10-CM | POA: Diagnosis not present

## 2020-04-30 DIAGNOSIS — I87313 Chronic venous hypertension (idiopathic) with ulcer of bilateral lower extremity: Secondary | ICD-10-CM | POA: Diagnosis not present

## 2020-04-30 DIAGNOSIS — E1122 Type 2 diabetes mellitus with diabetic chronic kidney disease: Secondary | ICD-10-CM | POA: Diagnosis not present

## 2020-04-30 DIAGNOSIS — L97811 Non-pressure chronic ulcer of other part of right lower leg limited to breakdown of skin: Secondary | ICD-10-CM | POA: Diagnosis not present

## 2020-05-02 DIAGNOSIS — Z7984 Long term (current) use of oral hypoglycemic drugs: Secondary | ICD-10-CM | POA: Diagnosis not present

## 2020-05-02 DIAGNOSIS — L97811 Non-pressure chronic ulcer of other part of right lower leg limited to breakdown of skin: Secondary | ICD-10-CM | POA: Diagnosis not present

## 2020-05-02 DIAGNOSIS — I482 Chronic atrial fibrillation, unspecified: Secondary | ICD-10-CM | POA: Diagnosis not present

## 2020-05-02 DIAGNOSIS — E1142 Type 2 diabetes mellitus with diabetic polyneuropathy: Secondary | ICD-10-CM | POA: Diagnosis not present

## 2020-05-02 DIAGNOSIS — L97821 Non-pressure chronic ulcer of other part of left lower leg limited to breakdown of skin: Secondary | ICD-10-CM | POA: Diagnosis not present

## 2020-05-02 DIAGNOSIS — E1122 Type 2 diabetes mellitus with diabetic chronic kidney disease: Secondary | ICD-10-CM | POA: Diagnosis not present

## 2020-05-02 DIAGNOSIS — I129 Hypertensive chronic kidney disease with stage 1 through stage 4 chronic kidney disease, or unspecified chronic kidney disease: Secondary | ICD-10-CM | POA: Diagnosis not present

## 2020-05-02 DIAGNOSIS — E1151 Type 2 diabetes mellitus with diabetic peripheral angiopathy without gangrene: Secondary | ICD-10-CM | POA: Diagnosis not present

## 2020-05-02 DIAGNOSIS — I87313 Chronic venous hypertension (idiopathic) with ulcer of bilateral lower extremity: Secondary | ICD-10-CM | POA: Diagnosis not present

## 2020-05-02 DIAGNOSIS — N183 Chronic kidney disease, stage 3 unspecified: Secondary | ICD-10-CM | POA: Diagnosis not present

## 2020-05-05 DIAGNOSIS — I87313 Chronic venous hypertension (idiopathic) with ulcer of bilateral lower extremity: Secondary | ICD-10-CM | POA: Diagnosis not present

## 2020-05-05 DIAGNOSIS — E1122 Type 2 diabetes mellitus with diabetic chronic kidney disease: Secondary | ICD-10-CM | POA: Diagnosis not present

## 2020-05-05 DIAGNOSIS — N183 Chronic kidney disease, stage 3 unspecified: Secondary | ICD-10-CM | POA: Diagnosis not present

## 2020-05-05 DIAGNOSIS — I482 Chronic atrial fibrillation, unspecified: Secondary | ICD-10-CM | POA: Diagnosis not present

## 2020-05-05 DIAGNOSIS — E1142 Type 2 diabetes mellitus with diabetic polyneuropathy: Secondary | ICD-10-CM | POA: Diagnosis not present

## 2020-05-05 DIAGNOSIS — L97811 Non-pressure chronic ulcer of other part of right lower leg limited to breakdown of skin: Secondary | ICD-10-CM | POA: Diagnosis not present

## 2020-05-05 DIAGNOSIS — I129 Hypertensive chronic kidney disease with stage 1 through stage 4 chronic kidney disease, or unspecified chronic kidney disease: Secondary | ICD-10-CM | POA: Diagnosis not present

## 2020-05-05 DIAGNOSIS — L97821 Non-pressure chronic ulcer of other part of left lower leg limited to breakdown of skin: Secondary | ICD-10-CM | POA: Diagnosis not present

## 2020-05-05 DIAGNOSIS — Z7984 Long term (current) use of oral hypoglycemic drugs: Secondary | ICD-10-CM | POA: Diagnosis not present

## 2020-05-05 DIAGNOSIS — E1151 Type 2 diabetes mellitus with diabetic peripheral angiopathy without gangrene: Secondary | ICD-10-CM | POA: Diagnosis not present

## 2020-05-06 DIAGNOSIS — E1140 Type 2 diabetes mellitus with diabetic neuropathy, unspecified: Secondary | ICD-10-CM | POA: Diagnosis not present

## 2020-05-06 DIAGNOSIS — E1151 Type 2 diabetes mellitus with diabetic peripheral angiopathy without gangrene: Secondary | ICD-10-CM | POA: Diagnosis not present

## 2020-05-07 ENCOUNTER — Other Ambulatory Visit: Payer: Self-pay | Admitting: *Deleted

## 2020-05-07 DIAGNOSIS — I87313 Chronic venous hypertension (idiopathic) with ulcer of bilateral lower extremity: Secondary | ICD-10-CM | POA: Diagnosis not present

## 2020-05-07 DIAGNOSIS — I482 Chronic atrial fibrillation, unspecified: Secondary | ICD-10-CM | POA: Diagnosis not present

## 2020-05-07 DIAGNOSIS — Z7984 Long term (current) use of oral hypoglycemic drugs: Secondary | ICD-10-CM | POA: Diagnosis not present

## 2020-05-07 DIAGNOSIS — N183 Chronic kidney disease, stage 3 unspecified: Secondary | ICD-10-CM | POA: Diagnosis not present

## 2020-05-07 DIAGNOSIS — L97821 Non-pressure chronic ulcer of other part of left lower leg limited to breakdown of skin: Secondary | ICD-10-CM | POA: Diagnosis not present

## 2020-05-07 DIAGNOSIS — E1142 Type 2 diabetes mellitus with diabetic polyneuropathy: Secondary | ICD-10-CM | POA: Diagnosis not present

## 2020-05-07 DIAGNOSIS — I129 Hypertensive chronic kidney disease with stage 1 through stage 4 chronic kidney disease, or unspecified chronic kidney disease: Secondary | ICD-10-CM | POA: Diagnosis not present

## 2020-05-07 DIAGNOSIS — E1122 Type 2 diabetes mellitus with diabetic chronic kidney disease: Secondary | ICD-10-CM | POA: Diagnosis not present

## 2020-05-07 DIAGNOSIS — L97811 Non-pressure chronic ulcer of other part of right lower leg limited to breakdown of skin: Secondary | ICD-10-CM | POA: Diagnosis not present

## 2020-05-07 DIAGNOSIS — E1151 Type 2 diabetes mellitus with diabetic peripheral angiopathy without gangrene: Secondary | ICD-10-CM | POA: Diagnosis not present

## 2020-05-07 MED ORDER — LORAZEPAM 1 MG PO TABS: ORAL_TABLET | ORAL | 0 refills | Status: DC

## 2020-05-09 DIAGNOSIS — E1151 Type 2 diabetes mellitus with diabetic peripheral angiopathy without gangrene: Secondary | ICD-10-CM | POA: Diagnosis not present

## 2020-05-09 DIAGNOSIS — L97821 Non-pressure chronic ulcer of other part of left lower leg limited to breakdown of skin: Secondary | ICD-10-CM | POA: Diagnosis not present

## 2020-05-09 DIAGNOSIS — L97811 Non-pressure chronic ulcer of other part of right lower leg limited to breakdown of skin: Secondary | ICD-10-CM | POA: Diagnosis not present

## 2020-05-09 DIAGNOSIS — I482 Chronic atrial fibrillation, unspecified: Secondary | ICD-10-CM | POA: Diagnosis not present

## 2020-05-09 DIAGNOSIS — Z7984 Long term (current) use of oral hypoglycemic drugs: Secondary | ICD-10-CM | POA: Diagnosis not present

## 2020-05-09 DIAGNOSIS — E1142 Type 2 diabetes mellitus with diabetic polyneuropathy: Secondary | ICD-10-CM | POA: Diagnosis not present

## 2020-05-09 DIAGNOSIS — N183 Chronic kidney disease, stage 3 unspecified: Secondary | ICD-10-CM | POA: Diagnosis not present

## 2020-05-09 DIAGNOSIS — I129 Hypertensive chronic kidney disease with stage 1 through stage 4 chronic kidney disease, or unspecified chronic kidney disease: Secondary | ICD-10-CM | POA: Diagnosis not present

## 2020-05-09 DIAGNOSIS — E1122 Type 2 diabetes mellitus with diabetic chronic kidney disease: Secondary | ICD-10-CM | POA: Diagnosis not present

## 2020-05-09 DIAGNOSIS — I87313 Chronic venous hypertension (idiopathic) with ulcer of bilateral lower extremity: Secondary | ICD-10-CM | POA: Diagnosis not present

## 2020-05-12 DIAGNOSIS — E1142 Type 2 diabetes mellitus with diabetic polyneuropathy: Secondary | ICD-10-CM | POA: Diagnosis not present

## 2020-05-12 DIAGNOSIS — Z7984 Long term (current) use of oral hypoglycemic drugs: Secondary | ICD-10-CM | POA: Diagnosis not present

## 2020-05-12 DIAGNOSIS — E1151 Type 2 diabetes mellitus with diabetic peripheral angiopathy without gangrene: Secondary | ICD-10-CM | POA: Diagnosis not present

## 2020-05-12 DIAGNOSIS — E1122 Type 2 diabetes mellitus with diabetic chronic kidney disease: Secondary | ICD-10-CM | POA: Diagnosis not present

## 2020-05-12 DIAGNOSIS — N183 Chronic kidney disease, stage 3 unspecified: Secondary | ICD-10-CM | POA: Diagnosis not present

## 2020-05-12 DIAGNOSIS — I482 Chronic atrial fibrillation, unspecified: Secondary | ICD-10-CM | POA: Diagnosis not present

## 2020-05-12 DIAGNOSIS — L97821 Non-pressure chronic ulcer of other part of left lower leg limited to breakdown of skin: Secondary | ICD-10-CM | POA: Diagnosis not present

## 2020-05-12 DIAGNOSIS — I129 Hypertensive chronic kidney disease with stage 1 through stage 4 chronic kidney disease, or unspecified chronic kidney disease: Secondary | ICD-10-CM | POA: Diagnosis not present

## 2020-05-12 DIAGNOSIS — I87313 Chronic venous hypertension (idiopathic) with ulcer of bilateral lower extremity: Secondary | ICD-10-CM | POA: Diagnosis not present

## 2020-05-12 DIAGNOSIS — L97811 Non-pressure chronic ulcer of other part of right lower leg limited to breakdown of skin: Secondary | ICD-10-CM | POA: Diagnosis not present

## 2020-05-14 ENCOUNTER — Ambulatory Visit: Payer: Medicare Other | Admitting: Vascular Surgery

## 2020-05-14 ENCOUNTER — Other Ambulatory Visit: Payer: Self-pay

## 2020-05-14 ENCOUNTER — Encounter: Payer: Self-pay | Admitting: Vascular Surgery

## 2020-05-14 VITALS — BP 127/71 | HR 67 | Temp 98.1°F | Resp 16 | Ht 63.0 in | Wt 264.0 lb

## 2020-05-14 DIAGNOSIS — I83812 Varicose veins of left lower extremities with pain: Secondary | ICD-10-CM

## 2020-05-14 DIAGNOSIS — M7989 Other specified soft tissue disorders: Secondary | ICD-10-CM

## 2020-05-14 HISTORY — PX: ENDOVENOUS ABLATION SAPHENOUS VEIN W/ LASER: SUR449

## 2020-05-14 NOTE — Progress Notes (Signed)
     Laser Ablation Procedure    Date: 05/14/2020   Chelsea Bates DOB:1950-05-22  Consent signed: Yes      Surgeon: Fabienne Bruns MD   Procedure: Laser Ablation: left Greater Saphenous Vein  BP 127/71 (BP Location: Left Arm, Patient Position: Sitting, Cuff Size: Large)   Pulse 67   Temp 98.1 F (36.7 C) (Temporal)   Resp 16   Ht 5\' 3"  (1.6 m)   Wt 264 lb (119.7 kg)   SpO2 98%   BMI 46.77 kg/m   Tumescent Anesthesia: 300 cc 0.9% NaCl with 50 cc Lidocaine HCL 1%  and 15 cc 8.4% NaHCO3  Local Anesthesia: 4 cc Lidocaine HCL and NaHCO3 (ratio 2:1)  7 watts continuous mode     Total energy: 1434 Joules    Total time: 204 seconds Treatment Length  29 cm  Laser Fiber Ref. #                              Lot # [id number]    Patient tolerated procedure well  Notes: Patient wore face mask.  All staff members wore facial masks and facial shields/goggles.  [name] took Ativan 1 mg on 05-14-2020 at 9:00 AM.  Redness and irritation noted on abdominal folds (above left groin area) before procedure started. Discussed with [name] and suggested she use Desitin or similar product on this irritated  area and wear loose fitting underwear/boxers.    Description of Procedure:  After marking the course of the secondary varicosities, the patient was placed on the operating table in the supine position, and the left leg was prepped and draped in sterile fashion.   Local anesthetic was administered and under ultrasound guidance the saphenous vein was accessed with a micro needle and guide wire; then the mirco puncture sheath was placed.  There was some initial difficulty cannulating the vein due to the patient's obesity as well as edema fluid but we were able to successfully cannulate this.  A guide wire was inserted saphenofemoral junction , followed by a 5 french sheath.  The position of the sheath and then the laser fiber below the junction was confirmed using the ultrasound.   Tumescent anesthesia was administered along the course of the saphenous vein using ultrasound guidance. The patient was placed in Trendelenburg position and protective laser glasses were placed on patient and staff, and the laser was fired at 7 watts continuous mode for a total of 1434 joules.      Steri strip was applied to the IV insertion site and ABD pads placed over left thigh.  Ace wrap bandages were applied over the left thigh and at the top of the saphenofemoral junction. Blood loss was less than 15 cc.  Discharge instructions reviewed with patient and hardcopy of discharge instructions given to patient to take home. The patient ambulated out of the operating room having tolerated the procedure well.  05-16-2020, MD [facility] Office: [phone]

## 2020-05-17 DIAGNOSIS — L97821 Non-pressure chronic ulcer of other part of left lower leg limited to breakdown of skin: Secondary | ICD-10-CM | POA: Diagnosis not present

## 2020-05-17 DIAGNOSIS — I482 Chronic atrial fibrillation, unspecified: Secondary | ICD-10-CM | POA: Diagnosis not present

## 2020-05-17 DIAGNOSIS — E1151 Type 2 diabetes mellitus with diabetic peripheral angiopathy without gangrene: Secondary | ICD-10-CM | POA: Diagnosis not present

## 2020-05-17 DIAGNOSIS — N183 Chronic kidney disease, stage 3 unspecified: Secondary | ICD-10-CM | POA: Diagnosis not present

## 2020-05-17 DIAGNOSIS — E1142 Type 2 diabetes mellitus with diabetic polyneuropathy: Secondary | ICD-10-CM | POA: Diagnosis not present

## 2020-05-17 DIAGNOSIS — L97811 Non-pressure chronic ulcer of other part of right lower leg limited to breakdown of skin: Secondary | ICD-10-CM | POA: Diagnosis not present

## 2020-05-17 DIAGNOSIS — E1122 Type 2 diabetes mellitus with diabetic chronic kidney disease: Secondary | ICD-10-CM | POA: Diagnosis not present

## 2020-05-17 DIAGNOSIS — I129 Hypertensive chronic kidney disease with stage 1 through stage 4 chronic kidney disease, or unspecified chronic kidney disease: Secondary | ICD-10-CM | POA: Diagnosis not present

## 2020-05-17 DIAGNOSIS — I87313 Chronic venous hypertension (idiopathic) with ulcer of bilateral lower extremity: Secondary | ICD-10-CM | POA: Diagnosis not present

## 2020-05-17 DIAGNOSIS — Z7984 Long term (current) use of oral hypoglycemic drugs: Secondary | ICD-10-CM | POA: Diagnosis not present

## 2020-05-20 DIAGNOSIS — I129 Hypertensive chronic kidney disease with stage 1 through stage 4 chronic kidney disease, or unspecified chronic kidney disease: Secondary | ICD-10-CM | POA: Diagnosis not present

## 2020-05-20 DIAGNOSIS — L97821 Non-pressure chronic ulcer of other part of left lower leg limited to breakdown of skin: Secondary | ICD-10-CM | POA: Diagnosis not present

## 2020-05-20 DIAGNOSIS — E1142 Type 2 diabetes mellitus with diabetic polyneuropathy: Secondary | ICD-10-CM | POA: Diagnosis not present

## 2020-05-20 DIAGNOSIS — E1122 Type 2 diabetes mellitus with diabetic chronic kidney disease: Secondary | ICD-10-CM | POA: Diagnosis not present

## 2020-05-20 DIAGNOSIS — Z7984 Long term (current) use of oral hypoglycemic drugs: Secondary | ICD-10-CM | POA: Diagnosis not present

## 2020-05-20 DIAGNOSIS — L97811 Non-pressure chronic ulcer of other part of right lower leg limited to breakdown of skin: Secondary | ICD-10-CM | POA: Diagnosis not present

## 2020-05-20 DIAGNOSIS — I87313 Chronic venous hypertension (idiopathic) with ulcer of bilateral lower extremity: Secondary | ICD-10-CM | POA: Diagnosis not present

## 2020-05-20 DIAGNOSIS — E1151 Type 2 diabetes mellitus with diabetic peripheral angiopathy without gangrene: Secondary | ICD-10-CM | POA: Diagnosis not present

## 2020-05-20 DIAGNOSIS — I482 Chronic atrial fibrillation, unspecified: Secondary | ICD-10-CM | POA: Diagnosis not present

## 2020-05-20 DIAGNOSIS — N183 Chronic kidney disease, stage 3 unspecified: Secondary | ICD-10-CM | POA: Diagnosis not present

## 2020-05-21 ENCOUNTER — Ambulatory Visit (INDEPENDENT_AMBULATORY_CARE_PROVIDER_SITE_OTHER): Payer: Medicare Other | Admitting: Vascular Surgery

## 2020-05-21 ENCOUNTER — Encounter: Payer: Self-pay | Admitting: Vascular Surgery

## 2020-05-21 ENCOUNTER — Other Ambulatory Visit: Payer: Self-pay | Admitting: *Deleted

## 2020-05-21 ENCOUNTER — Other Ambulatory Visit: Payer: Self-pay

## 2020-05-21 ENCOUNTER — Ambulatory Visit (HOSPITAL_COMMUNITY)
Admission: RE | Admit: 2020-05-21 | Discharge: 2020-05-21 | Disposition: A | Discharge status: Home / Self Care | Payer: Medicare Other | Source: Ambulatory Visit | Attending: Vascular Surgery | Admitting: Vascular Surgery

## 2020-05-21 VITALS — BP 144/69 | HR 65 | Temp 97.7°F | Resp 14 | Ht 63.0 in | Wt 264.0 lb

## 2020-05-21 DIAGNOSIS — I83812 Varicose veins of left lower extremities with pain: Secondary | ICD-10-CM

## 2020-05-21 DIAGNOSIS — I83813 Varicose veins of bilateral lower extremities with pain: Secondary | ICD-10-CM | POA: Diagnosis not present

## 2020-05-21 NOTE — Progress Notes (Signed)
Patient returns today for postoperative follow-up.  She underwent laser ablation of her left greater saphenous vein on May 14, 2020.  She really had no significant postoperative pain.  She has no shortness of breath or chest pain.  She is continuing to wear her compressive dressing.  She is continuing home health visits for the wound on her left leg.  We are also considering laser ablation on her right leg which also has a wound.  Physical exam:  Vitals:   05/21/20 1040  BP: (!) 144/69  Pulse: 65  Resp: 14  Temp: 97.7 F (36.5 C)  TempSrc: Temporal  SpO2: 96%  Weight: 264 lb (119.7 kg)  Height: 5\' 3"  (1.6 m)    Extremities: Small area of ecchymosis left mid thigh nontender to palpation no hematoma  Data: Patient had a duplex ultrasound today which shows good closure of her saphenous vein within 2 cm of the saphenofemoral junction no evidence of DVT.  Assessment: Successful closure of the left greater saphenous vein.  Patient will be scheduled for laser ablation of her right greater saphenous vein in the near future for assistance in wound healing in the right leg.  Plan: See above  , MD [facility] Office: [phone]

## 2020-05-22 DIAGNOSIS — L97811 Non-pressure chronic ulcer of other part of right lower leg limited to breakdown of skin: Secondary | ICD-10-CM | POA: Diagnosis not present

## 2020-05-22 DIAGNOSIS — I87313 Chronic venous hypertension (idiopathic) with ulcer of bilateral lower extremity: Secondary | ICD-10-CM | POA: Diagnosis not present

## 2020-05-22 DIAGNOSIS — I482 Chronic atrial fibrillation, unspecified: Secondary | ICD-10-CM | POA: Diagnosis not present

## 2020-05-22 DIAGNOSIS — Z7984 Long term (current) use of oral hypoglycemic drugs: Secondary | ICD-10-CM | POA: Diagnosis not present

## 2020-05-22 DIAGNOSIS — E1142 Type 2 diabetes mellitus with diabetic polyneuropathy: Secondary | ICD-10-CM | POA: Diagnosis not present

## 2020-05-22 DIAGNOSIS — E1122 Type 2 diabetes mellitus with diabetic chronic kidney disease: Secondary | ICD-10-CM | POA: Diagnosis not present

## 2020-05-22 DIAGNOSIS — L97821 Non-pressure chronic ulcer of other part of left lower leg limited to breakdown of skin: Secondary | ICD-10-CM | POA: Diagnosis not present

## 2020-05-22 DIAGNOSIS — E1151 Type 2 diabetes mellitus with diabetic peripheral angiopathy without gangrene: Secondary | ICD-10-CM | POA: Diagnosis not present

## 2020-05-22 DIAGNOSIS — N183 Chronic kidney disease, stage 3 unspecified: Secondary | ICD-10-CM | POA: Diagnosis not present

## 2020-05-22 DIAGNOSIS — I129 Hypertensive chronic kidney disease with stage 1 through stage 4 chronic kidney disease, or unspecified chronic kidney disease: Secondary | ICD-10-CM | POA: Diagnosis not present

## 2020-05-24 DIAGNOSIS — I129 Hypertensive chronic kidney disease with stage 1 through stage 4 chronic kidney disease, or unspecified chronic kidney disease: Secondary | ICD-10-CM | POA: Diagnosis not present

## 2020-05-24 DIAGNOSIS — N183 Chronic kidney disease, stage 3 unspecified: Secondary | ICD-10-CM | POA: Diagnosis not present

## 2020-05-24 DIAGNOSIS — E1122 Type 2 diabetes mellitus with diabetic chronic kidney disease: Secondary | ICD-10-CM | POA: Diagnosis not present

## 2020-05-24 DIAGNOSIS — I87313 Chronic venous hypertension (idiopathic) with ulcer of bilateral lower extremity: Secondary | ICD-10-CM | POA: Diagnosis not present

## 2020-05-24 DIAGNOSIS — E1142 Type 2 diabetes mellitus with diabetic polyneuropathy: Secondary | ICD-10-CM | POA: Diagnosis not present

## 2020-05-24 DIAGNOSIS — I482 Chronic atrial fibrillation, unspecified: Secondary | ICD-10-CM | POA: Diagnosis not present

## 2020-05-24 DIAGNOSIS — E1151 Type 2 diabetes mellitus with diabetic peripheral angiopathy without gangrene: Secondary | ICD-10-CM | POA: Diagnosis not present

## 2020-05-24 DIAGNOSIS — Z7984 Long term (current) use of oral hypoglycemic drugs: Secondary | ICD-10-CM | POA: Diagnosis not present

## 2020-05-24 DIAGNOSIS — L97821 Non-pressure chronic ulcer of other part of left lower leg limited to breakdown of skin: Secondary | ICD-10-CM | POA: Diagnosis not present

## 2020-05-24 DIAGNOSIS — L97811 Non-pressure chronic ulcer of other part of right lower leg limited to breakdown of skin: Secondary | ICD-10-CM | POA: Diagnosis not present

## 2020-05-26 DIAGNOSIS — E1151 Type 2 diabetes mellitus with diabetic peripheral angiopathy without gangrene: Secondary | ICD-10-CM | POA: Diagnosis not present

## 2020-05-26 DIAGNOSIS — I87313 Chronic venous hypertension (idiopathic) with ulcer of bilateral lower extremity: Secondary | ICD-10-CM | POA: Diagnosis not present

## 2020-05-26 DIAGNOSIS — N183 Chronic kidney disease, stage 3 unspecified: Secondary | ICD-10-CM | POA: Diagnosis not present

## 2020-05-26 DIAGNOSIS — I129 Hypertensive chronic kidney disease with stage 1 through stage 4 chronic kidney disease, or unspecified chronic kidney disease: Secondary | ICD-10-CM | POA: Diagnosis not present

## 2020-05-26 DIAGNOSIS — I482 Chronic atrial fibrillation, unspecified: Secondary | ICD-10-CM | POA: Diagnosis not present

## 2020-05-26 DIAGNOSIS — Z7984 Long term (current) use of oral hypoglycemic drugs: Secondary | ICD-10-CM | POA: Diagnosis not present

## 2020-05-26 DIAGNOSIS — E1142 Type 2 diabetes mellitus with diabetic polyneuropathy: Secondary | ICD-10-CM | POA: Diagnosis not present

## 2020-05-26 DIAGNOSIS — E1122 Type 2 diabetes mellitus with diabetic chronic kidney disease: Secondary | ICD-10-CM | POA: Diagnosis not present

## 2020-05-26 DIAGNOSIS — L97821 Non-pressure chronic ulcer of other part of left lower leg limited to breakdown of skin: Secondary | ICD-10-CM | POA: Diagnosis not present

## 2020-05-26 DIAGNOSIS — L97811 Non-pressure chronic ulcer of other part of right lower leg limited to breakdown of skin: Secondary | ICD-10-CM | POA: Diagnosis not present

## 2020-05-28 DIAGNOSIS — Z7984 Long term (current) use of oral hypoglycemic drugs: Secondary | ICD-10-CM | POA: Diagnosis not present

## 2020-05-28 DIAGNOSIS — L97811 Non-pressure chronic ulcer of other part of right lower leg limited to breakdown of skin: Secondary | ICD-10-CM | POA: Diagnosis not present

## 2020-05-28 DIAGNOSIS — I87313 Chronic venous hypertension (idiopathic) with ulcer of bilateral lower extremity: Secondary | ICD-10-CM | POA: Diagnosis not present

## 2020-05-28 DIAGNOSIS — N183 Chronic kidney disease, stage 3 unspecified: Secondary | ICD-10-CM | POA: Diagnosis not present

## 2020-05-28 DIAGNOSIS — I482 Chronic atrial fibrillation, unspecified: Secondary | ICD-10-CM | POA: Diagnosis not present

## 2020-05-28 DIAGNOSIS — E1142 Type 2 diabetes mellitus with diabetic polyneuropathy: Secondary | ICD-10-CM | POA: Diagnosis not present

## 2020-05-28 DIAGNOSIS — I129 Hypertensive chronic kidney disease with stage 1 through stage 4 chronic kidney disease, or unspecified chronic kidney disease: Secondary | ICD-10-CM | POA: Diagnosis not present

## 2020-05-28 DIAGNOSIS — E1151 Type 2 diabetes mellitus with diabetic peripheral angiopathy without gangrene: Secondary | ICD-10-CM | POA: Diagnosis not present

## 2020-05-28 DIAGNOSIS — E1122 Type 2 diabetes mellitus with diabetic chronic kidney disease: Secondary | ICD-10-CM | POA: Diagnosis not present

## 2020-05-28 DIAGNOSIS — L97821 Non-pressure chronic ulcer of other part of left lower leg limited to breakdown of skin: Secondary | ICD-10-CM | POA: Diagnosis not present

## 2020-05-29 DIAGNOSIS — Z23 Encounter for immunization: Secondary | ICD-10-CM | POA: Diagnosis not present

## 2020-05-30 DIAGNOSIS — N183 Chronic kidney disease, stage 3 unspecified: Secondary | ICD-10-CM | POA: Diagnosis not present

## 2020-05-30 DIAGNOSIS — I482 Chronic atrial fibrillation, unspecified: Secondary | ICD-10-CM | POA: Diagnosis not present

## 2020-05-30 DIAGNOSIS — L97811 Non-pressure chronic ulcer of other part of right lower leg limited to breakdown of skin: Secondary | ICD-10-CM | POA: Diagnosis not present

## 2020-05-30 DIAGNOSIS — L97821 Non-pressure chronic ulcer of other part of left lower leg limited to breakdown of skin: Secondary | ICD-10-CM | POA: Diagnosis not present

## 2020-05-30 DIAGNOSIS — E1122 Type 2 diabetes mellitus with diabetic chronic kidney disease: Secondary | ICD-10-CM | POA: Diagnosis not present

## 2020-05-30 DIAGNOSIS — E1151 Type 2 diabetes mellitus with diabetic peripheral angiopathy without gangrene: Secondary | ICD-10-CM | POA: Diagnosis not present

## 2020-05-30 DIAGNOSIS — I129 Hypertensive chronic kidney disease with stage 1 through stage 4 chronic kidney disease, or unspecified chronic kidney disease: Secondary | ICD-10-CM | POA: Diagnosis not present

## 2020-05-30 DIAGNOSIS — I87313 Chronic venous hypertension (idiopathic) with ulcer of bilateral lower extremity: Secondary | ICD-10-CM | POA: Diagnosis not present

## 2020-05-31 DIAGNOSIS — L97811 Non-pressure chronic ulcer of other part of right lower leg limited to breakdown of skin: Secondary | ICD-10-CM | POA: Diagnosis not present

## 2020-05-31 DIAGNOSIS — I87313 Chronic venous hypertension (idiopathic) with ulcer of bilateral lower extremity: Secondary | ICD-10-CM | POA: Diagnosis not present

## 2020-05-31 DIAGNOSIS — Z7984 Long term (current) use of oral hypoglycemic drugs: Secondary | ICD-10-CM | POA: Diagnosis not present

## 2020-05-31 DIAGNOSIS — I129 Hypertensive chronic kidney disease with stage 1 through stage 4 chronic kidney disease, or unspecified chronic kidney disease: Secondary | ICD-10-CM | POA: Diagnosis not present

## 2020-05-31 DIAGNOSIS — E1151 Type 2 diabetes mellitus with diabetic peripheral angiopathy without gangrene: Secondary | ICD-10-CM | POA: Diagnosis not present

## 2020-05-31 DIAGNOSIS — I482 Chronic atrial fibrillation, unspecified: Secondary | ICD-10-CM | POA: Diagnosis not present

## 2020-05-31 DIAGNOSIS — L97821 Non-pressure chronic ulcer of other part of left lower leg limited to breakdown of skin: Secondary | ICD-10-CM | POA: Diagnosis not present

## 2020-05-31 DIAGNOSIS — E1122 Type 2 diabetes mellitus with diabetic chronic kidney disease: Secondary | ICD-10-CM | POA: Diagnosis not present

## 2020-05-31 DIAGNOSIS — N183 Chronic kidney disease, stage 3 unspecified: Secondary | ICD-10-CM | POA: Diagnosis not present

## 2020-05-31 DIAGNOSIS — E1142 Type 2 diabetes mellitus with diabetic polyneuropathy: Secondary | ICD-10-CM | POA: Diagnosis not present

## 2020-06-02 DIAGNOSIS — L97821 Non-pressure chronic ulcer of other part of left lower leg limited to breakdown of skin: Secondary | ICD-10-CM | POA: Diagnosis not present

## 2020-06-02 DIAGNOSIS — E1122 Type 2 diabetes mellitus with diabetic chronic kidney disease: Secondary | ICD-10-CM | POA: Diagnosis not present

## 2020-06-02 DIAGNOSIS — I129 Hypertensive chronic kidney disease with stage 1 through stage 4 chronic kidney disease, or unspecified chronic kidney disease: Secondary | ICD-10-CM | POA: Diagnosis not present

## 2020-06-02 DIAGNOSIS — E1151 Type 2 diabetes mellitus with diabetic peripheral angiopathy without gangrene: Secondary | ICD-10-CM | POA: Diagnosis not present

## 2020-06-02 DIAGNOSIS — I482 Chronic atrial fibrillation, unspecified: Secondary | ICD-10-CM | POA: Diagnosis not present

## 2020-06-02 DIAGNOSIS — E1142 Type 2 diabetes mellitus with diabetic polyneuropathy: Secondary | ICD-10-CM | POA: Diagnosis not present

## 2020-06-02 DIAGNOSIS — I87313 Chronic venous hypertension (idiopathic) with ulcer of bilateral lower extremity: Secondary | ICD-10-CM | POA: Diagnosis not present

## 2020-06-02 DIAGNOSIS — L97811 Non-pressure chronic ulcer of other part of right lower leg limited to breakdown of skin: Secondary | ICD-10-CM | POA: Diagnosis not present

## 2020-06-02 DIAGNOSIS — N183 Chronic kidney disease, stage 3 unspecified: Secondary | ICD-10-CM | POA: Diagnosis not present

## 2020-06-02 DIAGNOSIS — Z7984 Long term (current) use of oral hypoglycemic drugs: Secondary | ICD-10-CM | POA: Diagnosis not present

## 2020-06-04 DIAGNOSIS — I482 Chronic atrial fibrillation, unspecified: Secondary | ICD-10-CM | POA: Diagnosis not present

## 2020-06-04 DIAGNOSIS — I87313 Chronic venous hypertension (idiopathic) with ulcer of bilateral lower extremity: Secondary | ICD-10-CM | POA: Diagnosis not present

## 2020-06-04 DIAGNOSIS — N183 Chronic kidney disease, stage 3 unspecified: Secondary | ICD-10-CM | POA: Diagnosis not present

## 2020-06-04 DIAGNOSIS — Z7984 Long term (current) use of oral hypoglycemic drugs: Secondary | ICD-10-CM | POA: Diagnosis not present

## 2020-06-04 DIAGNOSIS — E1122 Type 2 diabetes mellitus with diabetic chronic kidney disease: Secondary | ICD-10-CM | POA: Diagnosis not present

## 2020-06-04 DIAGNOSIS — I129 Hypertensive chronic kidney disease with stage 1 through stage 4 chronic kidney disease, or unspecified chronic kidney disease: Secondary | ICD-10-CM | POA: Diagnosis not present

## 2020-06-04 DIAGNOSIS — L97821 Non-pressure chronic ulcer of other part of left lower leg limited to breakdown of skin: Secondary | ICD-10-CM | POA: Diagnosis not present

## 2020-06-04 DIAGNOSIS — L97811 Non-pressure chronic ulcer of other part of right lower leg limited to breakdown of skin: Secondary | ICD-10-CM | POA: Diagnosis not present

## 2020-06-04 DIAGNOSIS — E1151 Type 2 diabetes mellitus with diabetic peripheral angiopathy without gangrene: Secondary | ICD-10-CM | POA: Diagnosis not present

## 2020-06-04 DIAGNOSIS — E1142 Type 2 diabetes mellitus with diabetic polyneuropathy: Secondary | ICD-10-CM | POA: Diagnosis not present

## 2020-06-06 DIAGNOSIS — I482 Chronic atrial fibrillation, unspecified: Secondary | ICD-10-CM | POA: Diagnosis not present

## 2020-06-06 DIAGNOSIS — E1122 Type 2 diabetes mellitus with diabetic chronic kidney disease: Secondary | ICD-10-CM | POA: Diagnosis not present

## 2020-06-06 DIAGNOSIS — L97811 Non-pressure chronic ulcer of other part of right lower leg limited to breakdown of skin: Secondary | ICD-10-CM | POA: Diagnosis not present

## 2020-06-06 DIAGNOSIS — Z7984 Long term (current) use of oral hypoglycemic drugs: Secondary | ICD-10-CM | POA: Diagnosis not present

## 2020-06-06 DIAGNOSIS — L97821 Non-pressure chronic ulcer of other part of left lower leg limited to breakdown of skin: Secondary | ICD-10-CM | POA: Diagnosis not present

## 2020-06-06 DIAGNOSIS — I87313 Chronic venous hypertension (idiopathic) with ulcer of bilateral lower extremity: Secondary | ICD-10-CM | POA: Diagnosis not present

## 2020-06-06 DIAGNOSIS — N183 Chronic kidney disease, stage 3 unspecified: Secondary | ICD-10-CM | POA: Diagnosis not present

## 2020-06-06 DIAGNOSIS — E1142 Type 2 diabetes mellitus with diabetic polyneuropathy: Secondary | ICD-10-CM | POA: Diagnosis not present

## 2020-06-06 DIAGNOSIS — I129 Hypertensive chronic kidney disease with stage 1 through stage 4 chronic kidney disease, or unspecified chronic kidney disease: Secondary | ICD-10-CM | POA: Diagnosis not present

## 2020-06-06 DIAGNOSIS — E1151 Type 2 diabetes mellitus with diabetic peripheral angiopathy without gangrene: Secondary | ICD-10-CM | POA: Diagnosis not present

## 2020-06-09 DIAGNOSIS — L97821 Non-pressure chronic ulcer of other part of left lower leg limited to breakdown of skin: Secondary | ICD-10-CM | POA: Diagnosis not present

## 2020-06-09 DIAGNOSIS — E1122 Type 2 diabetes mellitus with diabetic chronic kidney disease: Secondary | ICD-10-CM | POA: Diagnosis not present

## 2020-06-09 DIAGNOSIS — N183 Chronic kidney disease, stage 3 unspecified: Secondary | ICD-10-CM | POA: Diagnosis not present

## 2020-06-09 DIAGNOSIS — Z7984 Long term (current) use of oral hypoglycemic drugs: Secondary | ICD-10-CM | POA: Diagnosis not present

## 2020-06-09 DIAGNOSIS — I87313 Chronic venous hypertension (idiopathic) with ulcer of bilateral lower extremity: Secondary | ICD-10-CM | POA: Diagnosis not present

## 2020-06-09 DIAGNOSIS — L97811 Non-pressure chronic ulcer of other part of right lower leg limited to breakdown of skin: Secondary | ICD-10-CM | POA: Diagnosis not present

## 2020-06-09 DIAGNOSIS — E1142 Type 2 diabetes mellitus with diabetic polyneuropathy: Secondary | ICD-10-CM | POA: Diagnosis not present

## 2020-06-09 DIAGNOSIS — I129 Hypertensive chronic kidney disease with stage 1 through stage 4 chronic kidney disease, or unspecified chronic kidney disease: Secondary | ICD-10-CM | POA: Diagnosis not present

## 2020-06-09 DIAGNOSIS — E1151 Type 2 diabetes mellitus with diabetic peripheral angiopathy without gangrene: Secondary | ICD-10-CM | POA: Diagnosis not present

## 2020-06-09 DIAGNOSIS — I482 Chronic atrial fibrillation, unspecified: Secondary | ICD-10-CM | POA: Diagnosis not present

## 2020-06-11 DIAGNOSIS — E1151 Type 2 diabetes mellitus with diabetic peripheral angiopathy without gangrene: Secondary | ICD-10-CM | POA: Diagnosis not present

## 2020-06-11 DIAGNOSIS — E1142 Type 2 diabetes mellitus with diabetic polyneuropathy: Secondary | ICD-10-CM | POA: Diagnosis not present

## 2020-06-11 DIAGNOSIS — L97821 Non-pressure chronic ulcer of other part of left lower leg limited to breakdown of skin: Secondary | ICD-10-CM | POA: Diagnosis not present

## 2020-06-11 DIAGNOSIS — I482 Chronic atrial fibrillation, unspecified: Secondary | ICD-10-CM | POA: Diagnosis not present

## 2020-06-11 DIAGNOSIS — I129 Hypertensive chronic kidney disease with stage 1 through stage 4 chronic kidney disease, or unspecified chronic kidney disease: Secondary | ICD-10-CM | POA: Diagnosis not present

## 2020-06-11 DIAGNOSIS — L97811 Non-pressure chronic ulcer of other part of right lower leg limited to breakdown of skin: Secondary | ICD-10-CM | POA: Diagnosis not present

## 2020-06-11 DIAGNOSIS — E1122 Type 2 diabetes mellitus with diabetic chronic kidney disease: Secondary | ICD-10-CM | POA: Diagnosis not present

## 2020-06-11 DIAGNOSIS — N183 Chronic kidney disease, stage 3 unspecified: Secondary | ICD-10-CM | POA: Diagnosis not present

## 2020-06-11 DIAGNOSIS — Z7984 Long term (current) use of oral hypoglycemic drugs: Secondary | ICD-10-CM | POA: Diagnosis not present

## 2020-06-11 DIAGNOSIS — I87313 Chronic venous hypertension (idiopathic) with ulcer of bilateral lower extremity: Secondary | ICD-10-CM | POA: Diagnosis not present

## 2020-06-14 DIAGNOSIS — L97811 Non-pressure chronic ulcer of other part of right lower leg limited to breakdown of skin: Secondary | ICD-10-CM | POA: Diagnosis not present

## 2020-06-14 DIAGNOSIS — I129 Hypertensive chronic kidney disease with stage 1 through stage 4 chronic kidney disease, or unspecified chronic kidney disease: Secondary | ICD-10-CM | POA: Diagnosis not present

## 2020-06-14 DIAGNOSIS — I87313 Chronic venous hypertension (idiopathic) with ulcer of bilateral lower extremity: Secondary | ICD-10-CM | POA: Diagnosis not present

## 2020-06-14 DIAGNOSIS — L97821 Non-pressure chronic ulcer of other part of left lower leg limited to breakdown of skin: Secondary | ICD-10-CM | POA: Diagnosis not present

## 2020-06-14 DIAGNOSIS — N183 Chronic kidney disease, stage 3 unspecified: Secondary | ICD-10-CM | POA: Diagnosis not present

## 2020-06-14 DIAGNOSIS — E1122 Type 2 diabetes mellitus with diabetic chronic kidney disease: Secondary | ICD-10-CM | POA: Diagnosis not present

## 2020-06-14 DIAGNOSIS — E1151 Type 2 diabetes mellitus with diabetic peripheral angiopathy without gangrene: Secondary | ICD-10-CM | POA: Diagnosis not present

## 2020-06-14 DIAGNOSIS — Z7984 Long term (current) use of oral hypoglycemic drugs: Secondary | ICD-10-CM | POA: Diagnosis not present

## 2020-06-14 DIAGNOSIS — E1142 Type 2 diabetes mellitus with diabetic polyneuropathy: Secondary | ICD-10-CM | POA: Diagnosis not present

## 2020-06-14 DIAGNOSIS — I482 Chronic atrial fibrillation, unspecified: Secondary | ICD-10-CM | POA: Diagnosis not present

## 2020-06-16 DIAGNOSIS — I482 Chronic atrial fibrillation, unspecified: Secondary | ICD-10-CM | POA: Diagnosis not present

## 2020-06-16 DIAGNOSIS — I87313 Chronic venous hypertension (idiopathic) with ulcer of bilateral lower extremity: Secondary | ICD-10-CM | POA: Diagnosis not present

## 2020-06-16 DIAGNOSIS — I129 Hypertensive chronic kidney disease with stage 1 through stage 4 chronic kidney disease, or unspecified chronic kidney disease: Secondary | ICD-10-CM | POA: Diagnosis not present

## 2020-06-16 DIAGNOSIS — E1142 Type 2 diabetes mellitus with diabetic polyneuropathy: Secondary | ICD-10-CM | POA: Diagnosis not present

## 2020-06-16 DIAGNOSIS — L97811 Non-pressure chronic ulcer of other part of right lower leg limited to breakdown of skin: Secondary | ICD-10-CM | POA: Diagnosis not present

## 2020-06-16 DIAGNOSIS — E1151 Type 2 diabetes mellitus with diabetic peripheral angiopathy without gangrene: Secondary | ICD-10-CM | POA: Diagnosis not present

## 2020-06-16 DIAGNOSIS — N183 Chronic kidney disease, stage 3 unspecified: Secondary | ICD-10-CM | POA: Diagnosis not present

## 2020-06-16 DIAGNOSIS — E1122 Type 2 diabetes mellitus with diabetic chronic kidney disease: Secondary | ICD-10-CM | POA: Diagnosis not present

## 2020-06-16 DIAGNOSIS — L97821 Non-pressure chronic ulcer of other part of left lower leg limited to breakdown of skin: Secondary | ICD-10-CM | POA: Diagnosis not present

## 2020-06-16 DIAGNOSIS — Z7984 Long term (current) use of oral hypoglycemic drugs: Secondary | ICD-10-CM | POA: Diagnosis not present

## 2020-06-18 DIAGNOSIS — E1142 Type 2 diabetes mellitus with diabetic polyneuropathy: Secondary | ICD-10-CM | POA: Diagnosis not present

## 2020-06-18 DIAGNOSIS — I87313 Chronic venous hypertension (idiopathic) with ulcer of bilateral lower extremity: Secondary | ICD-10-CM | POA: Diagnosis not present

## 2020-06-18 DIAGNOSIS — I129 Hypertensive chronic kidney disease with stage 1 through stage 4 chronic kidney disease, or unspecified chronic kidney disease: Secondary | ICD-10-CM | POA: Diagnosis not present

## 2020-06-18 DIAGNOSIS — L97821 Non-pressure chronic ulcer of other part of left lower leg limited to breakdown of skin: Secondary | ICD-10-CM | POA: Diagnosis not present

## 2020-06-18 DIAGNOSIS — L97811 Non-pressure chronic ulcer of other part of right lower leg limited to breakdown of skin: Secondary | ICD-10-CM | POA: Diagnosis not present

## 2020-06-18 DIAGNOSIS — Z7984 Long term (current) use of oral hypoglycemic drugs: Secondary | ICD-10-CM | POA: Diagnosis not present

## 2020-06-18 DIAGNOSIS — E1122 Type 2 diabetes mellitus with diabetic chronic kidney disease: Secondary | ICD-10-CM | POA: Diagnosis not present

## 2020-06-18 DIAGNOSIS — I482 Chronic atrial fibrillation, unspecified: Secondary | ICD-10-CM | POA: Diagnosis not present

## 2020-06-18 DIAGNOSIS — N183 Chronic kidney disease, stage 3 unspecified: Secondary | ICD-10-CM | POA: Diagnosis not present

## 2020-06-18 DIAGNOSIS — E1151 Type 2 diabetes mellitus with diabetic peripheral angiopathy without gangrene: Secondary | ICD-10-CM | POA: Diagnosis not present

## 2020-06-20 ENCOUNTER — Encounter: Payer: Self-pay | Admitting: *Deleted

## 2020-06-21 DIAGNOSIS — Z7984 Long term (current) use of oral hypoglycemic drugs: Secondary | ICD-10-CM | POA: Diagnosis not present

## 2020-06-21 DIAGNOSIS — E1122 Type 2 diabetes mellitus with diabetic chronic kidney disease: Secondary | ICD-10-CM | POA: Diagnosis not present

## 2020-06-21 DIAGNOSIS — L97821 Non-pressure chronic ulcer of other part of left lower leg limited to breakdown of skin: Secondary | ICD-10-CM | POA: Diagnosis not present

## 2020-06-21 DIAGNOSIS — E1151 Type 2 diabetes mellitus with diabetic peripheral angiopathy without gangrene: Secondary | ICD-10-CM | POA: Diagnosis not present

## 2020-06-21 DIAGNOSIS — I129 Hypertensive chronic kidney disease with stage 1 through stage 4 chronic kidney disease, or unspecified chronic kidney disease: Secondary | ICD-10-CM | POA: Diagnosis not present

## 2020-06-21 DIAGNOSIS — I482 Chronic atrial fibrillation, unspecified: Secondary | ICD-10-CM | POA: Diagnosis not present

## 2020-06-21 DIAGNOSIS — I87313 Chronic venous hypertension (idiopathic) with ulcer of bilateral lower extremity: Secondary | ICD-10-CM | POA: Diagnosis not present

## 2020-06-21 DIAGNOSIS — Z4800 Encounter for change or removal of nonsurgical wound dressing: Secondary | ICD-10-CM | POA: Diagnosis not present

## 2020-06-21 DIAGNOSIS — L97811 Non-pressure chronic ulcer of other part of right lower leg limited to breakdown of skin: Secondary | ICD-10-CM | POA: Diagnosis not present

## 2020-06-21 DIAGNOSIS — E1142 Type 2 diabetes mellitus with diabetic polyneuropathy: Secondary | ICD-10-CM | POA: Diagnosis not present

## 2020-06-22 ENCOUNTER — Encounter: Payer: Self-pay | Admitting: Cardiology

## 2020-06-22 NOTE — Progress Notes (Signed)
Cardiology Office Note  Date: 06/23/2020   ID: Chelsea Bates, DOB 10-09-1950, MRN [id number]  PCP:  Richardean Chimera, MD  Cardiologist:  Nona Dell, MD Electrophysiologist:  None   Chief Complaint  Patient presents with  . Shortness of Breath    History of Present Illness: Chelsea Bates is a 70 y.o. female referred for cardiology consultation by Dr. Reuel Boom for the evaluation of leg swelling and shortness of breath.  I reviewed her records.  She has a history of varicose veins of both legs, also venous reflux and ulcers.  Currently having her legs dressed and tended to by home health nursing.  She underwent a relatively recent vein ablation on the lower on the left leg and states that this has helped, she anticipates similar procedure on the right.  She has been somewhat short of breath with activity, trying to lose some weight and plans to see a dietitian.  No exertional chest pain or palpitations.  I personally reviewed her ECG today which shows sinus rhythm with PACs.  I reviewed her current medications.  Jardiance was recently added by PCP.  She has no history of cardiomyopathy, no prior echocardiogram.  Past Medical History:  Diagnosis Date  . Allergic rhinitis due to pollen   . 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 Essential hypertension   . GERD (gastroesophageal reflux disease)   . Hyperlipidemia   . Obesity   . Peripheral neuropathy   . Type 2 diabetes mellitus (HCC)   . Varicose veins of both lower extremities   . Venous ulcers of both lower extremities [facility])     Past Surgical History:  Procedure Laterality Date  . CATARACT EXTRACTION Bilateral 2017   Dr. Edrick Oh in [place] for IOL implants  . CHOLECYSTECTOMY    . ENDOVENOUS ABLATION SAPHENOUS VEIN W/ LASER Left 05/14/2020   endovenous laser ablation left greater saphenous vein by Fabienne Bruns MD   . KNEE SURGERY    . TUBAL LIGATION      Current Outpatient Medications  Medication  Sig Dispense Refill  . atorvastatin (LIPITOR) 10 MG tablet Take 10 mg by mouth daily.    . Calcium Carbonate-Vitamin D3 600-400 MG-UNIT TABS Take 1 tablet by mouth daily.    Marland Kitchen diltiazem (CARDIZEM CD) 240 MG 24 hr capsule Take 240 mg by mouth daily.    . empagliflozin (JARDIANCE) 25 MG TABS tablet Take 25 mg by mouth daily.    Marland Kitchen glipiZIDE (GLUCOTROL XL) 10 MG 24 hr tablet Take 10 mg by mouth daily with breakfast.    . losartan-hydrochlorothiazide (HYZAAR) 100-25 MG tablet Take 1 tablet by mouth daily.    . metFORMIN (GLUCOPHAGE-XR) 500 MG 24 hr tablet Take 500 mg by mouth 4 (four) times daily.    . metoprolol tartrate (LOPRESSOR) 50 MG tablet Take 50 mg by mouth in the morning, at noon, and at bedtime.    Marland Kitchen omeprazole (PRILOSEC) 20 MG capsule Take 20 mg by mouth daily.     No current facility-administered medications for this visit.   Allergies:  Ace inhibitors and Peanut-containing drug products   Social History: The patient  reports that she quit smoking about 34 years ago. Her smoking use included cigarettes. She has never used smokeless tobacco. She reports that she does not drink alcohol and does not use drugs.   Family History: The patient's family history includes Anemia in her father; Dementia in her mother; Diabetes in her father; Heart disease  in her mother; Hypertension in her mother; Pneumonia in her father; Stroke in her father; Suicidality in her brother.   ROS: No orthopnea or PND.  Physical Exam: VS:  BP 138/60   Pulse 95   Ht 5\' 3"  (1.6 m)   Wt 263 lb 3.2 oz (119.4 kg)   SpO2 95%   BMI 46.62 kg/m , BMI Body mass index is 46.62 kg/m.  Wt Readings from Last 3 Encounters:  06/23/20 263 lb 3.2 oz (119.4 kg)  05/21/20 264 lb (119.7 kg)  05/14/20 264 lb (119.7 kg)    General: Patient appears comfortable at rest. HEENT: Conjunctiva and lids normal, wearing a mask. Neck: Supple, no elevated JVP or carotid bruits, no thyromegaly. Lungs: Clear to auscultation, nonlabored  breathing at rest. Cardiac: Regular rate and rhythm with ectopy, no S3 or significant systolic murmur, no pericardial rub. Abdomen: Soft, nontender, bowel sounds present. Extremities: Bilateral leg edema/lymphedema, legs are dressed. Skin: Warm and dry. Musculoskeletal: No kyphosis. Neuropsychiatric: Alert and oriented x3, affect grossly appropriate.  ECG: No old tracings available for review today.  Recent Labwork:  February 2022: TSH 2.89, BUN 19, creatinine 1.15, potassium 5.0, AST 13, ALT 12, hemoglobin A1c 6.4%, cholesterol 180, triglycerides 305, HDL 43, LDL 87  Other Studies Reviewed Today:  No prior cardiac testing available for review today.  Assessment and Plan:  1.  Bilateral leg edema with known history of varicose veins and reflux as well as venous ulcers.  She also looks to have some degree of lymphedema as well.  Reports some degree of shortness of breath, trying to work on diet and weight loss.  ECG reviewed.  Plan to obtain an echocardiogram for cardiac structural evaluation, mainly to exclude cardiomyopathy.  May need to consider Lasix instead of HCTZ component of Hyzaar.  2.  Essential hypertension, systolic in the 130s today.  She is currently on Cardizem CD, Lopressor, and Hyzaar.  Medication Adjustments/Labs and Tests Ordered: Current medicines are reviewed at length with the patient today.  Concerns regarding medicines are outlined above.   Tests Ordered: Orders Placed This Encounter  Procedures  . EKG 12-Lead  . ECHOCARDIOGRAM COMPLETE    Medication Changes: No orders of the defined types were placed in this encounter.   Disposition:  Follow up test results.  Signed, March 2022, MD, [facility] 06/23/2020 9:34 AM    [facility] at [facility] [address] [place], [place], [place] Phone: [phone]; Fax: [phone]

## 2020-06-23 ENCOUNTER — Ambulatory Visit (INDEPENDENT_AMBULATORY_CARE_PROVIDER_SITE_OTHER): Payer: Medicare Other

## 2020-06-23 ENCOUNTER — Ambulatory Visit: Payer: Medicare Other | Admitting: Cardiology

## 2020-06-23 ENCOUNTER — Encounter: Payer: Self-pay | Admitting: Cardiology

## 2020-06-23 VITALS — BP 138/60 | HR 95 | Ht 63.0 in | Wt 263.2 lb

## 2020-06-23 DIAGNOSIS — I83029 Varicose veins of left lower extremity with ulcer of unspecified site: Secondary | ICD-10-CM

## 2020-06-23 DIAGNOSIS — R600 Localized edema: Secondary | ICD-10-CM

## 2020-06-23 DIAGNOSIS — R0602 Shortness of breath: Secondary | ICD-10-CM

## 2020-06-23 DIAGNOSIS — I83019 Varicose veins of right lower extremity with ulcer of unspecified site: Secondary | ICD-10-CM | POA: Diagnosis not present

## 2020-06-23 DIAGNOSIS — Z7984 Long term (current) use of oral hypoglycemic drugs: Secondary | ICD-10-CM | POA: Diagnosis not present

## 2020-06-23 DIAGNOSIS — I10 Essential (primary) hypertension: Secondary | ICD-10-CM | POA: Diagnosis not present

## 2020-06-23 DIAGNOSIS — E1122 Type 2 diabetes mellitus with diabetic chronic kidney disease: Secondary | ICD-10-CM | POA: Diagnosis not present

## 2020-06-23 DIAGNOSIS — L97919 Non-pressure chronic ulcer of unspecified part of right lower leg with unspecified severity: Secondary | ICD-10-CM | POA: Diagnosis not present

## 2020-06-23 DIAGNOSIS — E1142 Type 2 diabetes mellitus with diabetic polyneuropathy: Secondary | ICD-10-CM | POA: Diagnosis not present

## 2020-06-23 DIAGNOSIS — I129 Hypertensive chronic kidney disease with stage 1 through stage 4 chronic kidney disease, or unspecified chronic kidney disease: Secondary | ICD-10-CM | POA: Diagnosis not present

## 2020-06-23 DIAGNOSIS — L97811 Non-pressure chronic ulcer of other part of right lower leg limited to breakdown of skin: Secondary | ICD-10-CM | POA: Diagnosis not present

## 2020-06-23 DIAGNOSIS — E1151 Type 2 diabetes mellitus with diabetic peripheral angiopathy without gangrene: Secondary | ICD-10-CM | POA: Diagnosis not present

## 2020-06-23 DIAGNOSIS — I482 Chronic atrial fibrillation, unspecified: Secondary | ICD-10-CM | POA: Diagnosis not present

## 2020-06-23 DIAGNOSIS — I872 Venous insufficiency (chronic) (peripheral): Secondary | ICD-10-CM | POA: Diagnosis not present

## 2020-06-23 DIAGNOSIS — Z4800 Encounter for change or removal of nonsurgical wound dressing: Secondary | ICD-10-CM | POA: Diagnosis not present

## 2020-06-23 DIAGNOSIS — L97929 Non-pressure chronic ulcer of unspecified part of left lower leg with unspecified severity: Secondary | ICD-10-CM

## 2020-06-23 DIAGNOSIS — I87313 Chronic venous hypertension (idiopathic) with ulcer of bilateral lower extremity: Secondary | ICD-10-CM | POA: Diagnosis not present

## 2020-06-23 DIAGNOSIS — L97821 Non-pressure chronic ulcer of other part of left lower leg limited to breakdown of skin: Secondary | ICD-10-CM | POA: Diagnosis not present

## 2020-06-23 LAB — ECHOCARDIOGRAM COMPLETE
Height: 63 in
MV M vel: 5.43 m/s
MV Peak grad: 117.9 mmHg
S' Lateral: 2 cm
Single Plane A2C EF: 67.8 %
Single Plane A4C EF: 67.3 %
Weight: 4211.2 oz

## 2020-06-23 NOTE — Patient Instructions (Addendum)
Medication Instructions:   Your physician recommends that you continue on your current medications as directed. Please refer to the Current Medication list given to you today.  Labwork:  none  Testing/Procedures: Your physician has requested that you have an echocardiogram. Echocardiography is a painless test that uses sound waves to create images of your heart. It provides your doctor with information about the size and shape of your heart and how well your heart's chambers and valves are working. This procedure takes approximately one hour. There are no restrictions for this procedure.  Follow-Up:  pending  Any Other Special Instructions Will Be Listed Below (If Applicable).  If you need a refill on your cardiac medications before your next appointment, please call your pharmacy.

## 2020-06-25 ENCOUNTER — Telehealth: Payer: Self-pay | Admitting: *Deleted

## 2020-06-25 DIAGNOSIS — I482 Chronic atrial fibrillation, unspecified: Secondary | ICD-10-CM | POA: Diagnosis not present

## 2020-06-25 DIAGNOSIS — L97821 Non-pressure chronic ulcer of other part of left lower leg limited to breakdown of skin: Secondary | ICD-10-CM | POA: Diagnosis not present

## 2020-06-25 DIAGNOSIS — E1151 Type 2 diabetes mellitus with diabetic peripheral angiopathy without gangrene: Secondary | ICD-10-CM | POA: Diagnosis not present

## 2020-06-25 DIAGNOSIS — L97811 Non-pressure chronic ulcer of other part of right lower leg limited to breakdown of skin: Secondary | ICD-10-CM | POA: Diagnosis not present

## 2020-06-25 DIAGNOSIS — I87313 Chronic venous hypertension (idiopathic) with ulcer of bilateral lower extremity: Secondary | ICD-10-CM | POA: Diagnosis not present

## 2020-06-25 DIAGNOSIS — I129 Hypertensive chronic kidney disease with stage 1 through stage 4 chronic kidney disease, or unspecified chronic kidney disease: Secondary | ICD-10-CM | POA: Diagnosis not present

## 2020-06-25 DIAGNOSIS — Z7984 Long term (current) use of oral hypoglycemic drugs: Secondary | ICD-10-CM | POA: Diagnosis not present

## 2020-06-25 DIAGNOSIS — E1142 Type 2 diabetes mellitus with diabetic polyneuropathy: Secondary | ICD-10-CM | POA: Diagnosis not present

## 2020-06-25 DIAGNOSIS — E1122 Type 2 diabetes mellitus with diabetic chronic kidney disease: Secondary | ICD-10-CM | POA: Diagnosis not present

## 2020-06-25 DIAGNOSIS — Z4800 Encounter for change or removal of nonsurgical wound dressing: Secondary | ICD-10-CM | POA: Diagnosis not present

## 2020-06-25 NOTE — Telephone Encounter (Signed)
Patient informed. Copy sent to PCP °

## 2020-06-25 NOTE — Telephone Encounter (Signed)
-----   Message from Jonelle Sidle, MD sent at 06/23/2020  1:11 PM EDT ----- Results reviewed.  Please let her know that today's echocardiogram shows vigorous LVEF greater than 75%, also normal RV contraction and estimated pulmonary artery systolic pressure.  No significant valvular abnormalities.  This would suggest that her peripheral edema is noncardiac in etiology, would keep follow-up with vascular specialist.  As needed diuretic could be considered by Dr. Reuel Boom for symptomatic improvement, but not required.  Cardiology follow-up can be as needed.

## 2020-06-30 DIAGNOSIS — E1122 Type 2 diabetes mellitus with diabetic chronic kidney disease: Secondary | ICD-10-CM | POA: Diagnosis not present

## 2020-06-30 DIAGNOSIS — Z4800 Encounter for change or removal of nonsurgical wound dressing: Secondary | ICD-10-CM | POA: Diagnosis not present

## 2020-06-30 DIAGNOSIS — Z7984 Long term (current) use of oral hypoglycemic drugs: Secondary | ICD-10-CM | POA: Diagnosis not present

## 2020-06-30 DIAGNOSIS — L97821 Non-pressure chronic ulcer of other part of left lower leg limited to breakdown of skin: Secondary | ICD-10-CM | POA: Diagnosis not present

## 2020-06-30 DIAGNOSIS — I129 Hypertensive chronic kidney disease with stage 1 through stage 4 chronic kidney disease, or unspecified chronic kidney disease: Secondary | ICD-10-CM | POA: Diagnosis not present

## 2020-06-30 DIAGNOSIS — I482 Chronic atrial fibrillation, unspecified: Secondary | ICD-10-CM | POA: Diagnosis not present

## 2020-06-30 DIAGNOSIS — L97811 Non-pressure chronic ulcer of other part of right lower leg limited to breakdown of skin: Secondary | ICD-10-CM | POA: Diagnosis not present

## 2020-06-30 DIAGNOSIS — E1142 Type 2 diabetes mellitus with diabetic polyneuropathy: Secondary | ICD-10-CM | POA: Diagnosis not present

## 2020-06-30 DIAGNOSIS — E1151 Type 2 diabetes mellitus with diabetic peripheral angiopathy without gangrene: Secondary | ICD-10-CM | POA: Diagnosis not present

## 2020-06-30 DIAGNOSIS — I87313 Chronic venous hypertension (idiopathic) with ulcer of bilateral lower extremity: Secondary | ICD-10-CM | POA: Diagnosis not present

## 2020-07-02 ENCOUNTER — Other Ambulatory Visit: Payer: Self-pay

## 2020-07-02 ENCOUNTER — Ambulatory Visit: Payer: Medicare Other | Admitting: Vascular Surgery

## 2020-07-02 ENCOUNTER — Encounter: Payer: Self-pay | Admitting: Vascular Surgery

## 2020-07-02 VITALS — BP 125/64 | HR 66 | Temp 98.0°F | Resp 16 | Ht 63.0 in | Wt 263.0 lb

## 2020-07-02 DIAGNOSIS — L97821 Non-pressure chronic ulcer of other part of left lower leg limited to breakdown of skin: Secondary | ICD-10-CM | POA: Diagnosis not present

## 2020-07-02 DIAGNOSIS — Z7984 Long term (current) use of oral hypoglycemic drugs: Secondary | ICD-10-CM | POA: Diagnosis not present

## 2020-07-02 DIAGNOSIS — I83811 Varicose veins of right lower extremities with pain: Secondary | ICD-10-CM | POA: Diagnosis not present

## 2020-07-02 DIAGNOSIS — I129 Hypertensive chronic kidney disease with stage 1 through stage 4 chronic kidney disease, or unspecified chronic kidney disease: Secondary | ICD-10-CM | POA: Diagnosis not present

## 2020-07-02 DIAGNOSIS — I87313 Chronic venous hypertension (idiopathic) with ulcer of bilateral lower extremity: Secondary | ICD-10-CM | POA: Diagnosis not present

## 2020-07-02 DIAGNOSIS — E1122 Type 2 diabetes mellitus with diabetic chronic kidney disease: Secondary | ICD-10-CM | POA: Diagnosis not present

## 2020-07-02 DIAGNOSIS — E1151 Type 2 diabetes mellitus with diabetic peripheral angiopathy without gangrene: Secondary | ICD-10-CM | POA: Diagnosis not present

## 2020-07-02 DIAGNOSIS — Z4800 Encounter for change or removal of nonsurgical wound dressing: Secondary | ICD-10-CM | POA: Diagnosis not present

## 2020-07-02 DIAGNOSIS — L97811 Non-pressure chronic ulcer of other part of right lower leg limited to breakdown of skin: Secondary | ICD-10-CM | POA: Diagnosis not present

## 2020-07-02 DIAGNOSIS — E1142 Type 2 diabetes mellitus with diabetic polyneuropathy: Secondary | ICD-10-CM | POA: Diagnosis not present

## 2020-07-02 DIAGNOSIS — I482 Chronic atrial fibrillation, unspecified: Secondary | ICD-10-CM | POA: Diagnosis not present

## 2020-07-02 HISTORY — PX: ENDOVENOUS ABLATION SAPHENOUS VEIN W/ LASER: SUR449

## 2020-07-02 NOTE — Progress Notes (Signed)
     Laser Ablation Procedure    Date: 07/02/2020   Chelsea Bates DOB:[birth year]  Consent signed: Yes      Surgeon: Fabienne Bruns MD   Procedure: Laser Ablation: right Greater Saphenous Vein  BP 125/64 (BP Location: Left Arm, Patient Position: Sitting, Cuff Size: Large)   Pulse 66   Temp 98 F (36.7 C) (Temporal)   Resp 16   Ht 5\' 3"  (1.6 m)   Wt 263 lb (119.3 kg)   SpO2 98%   BMI 46.59 kg/m   Tumescent Anesthesia: 400 cc 0.9% NaCl with 50 cc Lidocaine HCL 1%  and 15 cc 8.4% NaHCO3  Local Anesthesia: 3 cc Lidocaine HCL and NaHCO3 (ratio 2:1)  7 watts continuous mode     Total energy: 1180 Joules    Total time: 168 seconds Treatment Length 25 cm   Laser Fiber Ref. #    Lot # [id number]     Patient tolerated procedure well  Notes: Patient wore face mask.  All staff members wore facial masks and facial shields/goggles.  [name] took Ativan 1 mg on 07-02-2020 at 9:00 AM.    Description of Procedure:  After marking the course of the secondary varicosities, the patient was placed on the operating table in the supine position, and the right leg was prepped and draped in sterile fashion.   Local anesthetic was administered and under ultrasound guidance the saphenous vein was accessed with a micro needle and guide wire; then the mirco puncture sheath was placed.  A guide wire was inserted saphenofemoral junction , followed by a 5 french sheath.  The position of the sheath and then the laser fiber below the junction was confirmed using the ultrasound.  Tumescent anesthesia was administered along the course of the saphenous vein using ultrasound guidance. The patient was placed in Trendelenburg position and protective laser glasses were placed on patient and staff, and the laser was fired at 7 watts continuous mode for a total of 1180 joules.      Steri strip was applied to the IV insertion site and ABD pads and Ace wrap bandages were applied over the right thigh  and at the top of the saphenofemoral junction. Patient is wearing wound care dressing from knee down on right leg. Blood loss was less than 15 cc.  Discharge instructions reviewed with patient and hardcopy of discharge instructions given to patient to take home. The patient ambulated out of the operating room having tolerated the procedure well.   07-04-2020, MD [facility] Office: [phone]

## 2020-07-04 DIAGNOSIS — E1142 Type 2 diabetes mellitus with diabetic polyneuropathy: Secondary | ICD-10-CM | POA: Diagnosis not present

## 2020-07-04 DIAGNOSIS — I482 Chronic atrial fibrillation, unspecified: Secondary | ICD-10-CM | POA: Diagnosis not present

## 2020-07-04 DIAGNOSIS — Z7984 Long term (current) use of oral hypoglycemic drugs: Secondary | ICD-10-CM | POA: Diagnosis not present

## 2020-07-04 DIAGNOSIS — L97821 Non-pressure chronic ulcer of other part of left lower leg limited to breakdown of skin: Secondary | ICD-10-CM | POA: Diagnosis not present

## 2020-07-04 DIAGNOSIS — Z4800 Encounter for change or removal of nonsurgical wound dressing: Secondary | ICD-10-CM | POA: Diagnosis not present

## 2020-07-04 DIAGNOSIS — L97811 Non-pressure chronic ulcer of other part of right lower leg limited to breakdown of skin: Secondary | ICD-10-CM | POA: Diagnosis not present

## 2020-07-04 DIAGNOSIS — E1151 Type 2 diabetes mellitus with diabetic peripheral angiopathy without gangrene: Secondary | ICD-10-CM | POA: Diagnosis not present

## 2020-07-04 DIAGNOSIS — E1122 Type 2 diabetes mellitus with diabetic chronic kidney disease: Secondary | ICD-10-CM | POA: Diagnosis not present

## 2020-07-04 DIAGNOSIS — I129 Hypertensive chronic kidney disease with stage 1 through stage 4 chronic kidney disease, or unspecified chronic kidney disease: Secondary | ICD-10-CM | POA: Diagnosis not present

## 2020-07-04 DIAGNOSIS — I87313 Chronic venous hypertension (idiopathic) with ulcer of bilateral lower extremity: Secondary | ICD-10-CM | POA: Diagnosis not present

## 2020-07-07 DIAGNOSIS — I129 Hypertensive chronic kidney disease with stage 1 through stage 4 chronic kidney disease, or unspecified chronic kidney disease: Secondary | ICD-10-CM | POA: Diagnosis not present

## 2020-07-07 DIAGNOSIS — E1142 Type 2 diabetes mellitus with diabetic polyneuropathy: Secondary | ICD-10-CM | POA: Diagnosis not present

## 2020-07-07 DIAGNOSIS — I482 Chronic atrial fibrillation, unspecified: Secondary | ICD-10-CM | POA: Diagnosis not present

## 2020-07-07 DIAGNOSIS — L97821 Non-pressure chronic ulcer of other part of left lower leg limited to breakdown of skin: Secondary | ICD-10-CM | POA: Diagnosis not present

## 2020-07-07 DIAGNOSIS — Z4800 Encounter for change or removal of nonsurgical wound dressing: Secondary | ICD-10-CM | POA: Diagnosis not present

## 2020-07-07 DIAGNOSIS — E1151 Type 2 diabetes mellitus with diabetic peripheral angiopathy without gangrene: Secondary | ICD-10-CM | POA: Diagnosis not present

## 2020-07-07 DIAGNOSIS — I87313 Chronic venous hypertension (idiopathic) with ulcer of bilateral lower extremity: Secondary | ICD-10-CM | POA: Diagnosis not present

## 2020-07-07 DIAGNOSIS — L97811 Non-pressure chronic ulcer of other part of right lower leg limited to breakdown of skin: Secondary | ICD-10-CM | POA: Diagnosis not present

## 2020-07-07 DIAGNOSIS — Z7984 Long term (current) use of oral hypoglycemic drugs: Secondary | ICD-10-CM | POA: Diagnosis not present

## 2020-07-07 DIAGNOSIS — E1122 Type 2 diabetes mellitus with diabetic chronic kidney disease: Secondary | ICD-10-CM | POA: Diagnosis not present

## 2020-07-09 ENCOUNTER — Ambulatory Visit (INDEPENDENT_AMBULATORY_CARE_PROVIDER_SITE_OTHER): Payer: Medicare Other | Admitting: Vascular Surgery

## 2020-07-09 ENCOUNTER — Other Ambulatory Visit: Payer: Self-pay

## 2020-07-09 ENCOUNTER — Ambulatory Visit (HOSPITAL_COMMUNITY)
Admission: RE | Admit: 2020-07-09 | Discharge: 2020-07-09 | Disposition: A | Discharge status: Home / Self Care | Payer: Medicare Other | Source: Ambulatory Visit | Attending: Vascular Surgery | Admitting: Vascular Surgery

## 2020-07-09 ENCOUNTER — Encounter: Payer: Self-pay | Admitting: Vascular Surgery

## 2020-07-09 VITALS — BP 160/83 | HR 67 | Temp 98.0°F | Resp 16 | Ht 63.0 in | Wt 260.0 lb

## 2020-07-09 DIAGNOSIS — E1151 Type 2 diabetes mellitus with diabetic peripheral angiopathy without gangrene: Secondary | ICD-10-CM | POA: Diagnosis not present

## 2020-07-09 DIAGNOSIS — E1142 Type 2 diabetes mellitus with diabetic polyneuropathy: Secondary | ICD-10-CM | POA: Diagnosis not present

## 2020-07-09 DIAGNOSIS — I129 Hypertensive chronic kidney disease with stage 1 through stage 4 chronic kidney disease, or unspecified chronic kidney disease: Secondary | ICD-10-CM | POA: Diagnosis not present

## 2020-07-09 DIAGNOSIS — L97811 Non-pressure chronic ulcer of other part of right lower leg limited to breakdown of skin: Secondary | ICD-10-CM | POA: Diagnosis not present

## 2020-07-09 DIAGNOSIS — Z7984 Long term (current) use of oral hypoglycemic drugs: Secondary | ICD-10-CM | POA: Diagnosis not present

## 2020-07-09 DIAGNOSIS — I83813 Varicose veins of bilateral lower extremities with pain: Secondary | ICD-10-CM

## 2020-07-09 DIAGNOSIS — I87313 Chronic venous hypertension (idiopathic) with ulcer of bilateral lower extremity: Secondary | ICD-10-CM | POA: Diagnosis not present

## 2020-07-09 DIAGNOSIS — E1122 Type 2 diabetes mellitus with diabetic chronic kidney disease: Secondary | ICD-10-CM | POA: Diagnosis not present

## 2020-07-09 DIAGNOSIS — Z4800 Encounter for change or removal of nonsurgical wound dressing: Secondary | ICD-10-CM | POA: Diagnosis not present

## 2020-07-09 DIAGNOSIS — L97821 Non-pressure chronic ulcer of other part of left lower leg limited to breakdown of skin: Secondary | ICD-10-CM | POA: Diagnosis not present

## 2020-07-09 DIAGNOSIS — I482 Chronic atrial fibrillation, unspecified: Secondary | ICD-10-CM | POA: Diagnosis not present

## 2020-07-09 NOTE — Progress Notes (Signed)
Patient is a 70 year old female who returns for follow-up today.  She underwent laser ablation of her right greater saphenous vein on July 02, 2020.  She previously had undergone laser ablation on May 14, 2020.  She reports that the pain in her legs has improved significantly.  She has not had any shortness of breath or chest pain.  She has had no incisional drainage or redness.  She has bilateral compression garments to try to heal lower extremity wounds.  Physical exam:  Vitals:   07/09/20 1038  BP: (!) 160/83  Pulse: 67  Resp: 16  Temp: 98 F (36.7 C)  TempSrc: Temporal  SpO2: 97%  Weight: 260 lb (117.9 kg)  Height: 5\' 3"  (1.6 m)    Extremities: Well-healed needle puncture site right leg.  Both legs still have some edema bilaterally.  Data: Patient had duplex ultrasound today which shows good closure of her right greater saphenous vein with no evidence of DVT.  Assessment: Doing well status post bilateral greater saphenous laser ablation.  Plan: Patient will follow up on an as-needed basis.  , MD [facility] Office: [phone]

## 2020-07-14 DIAGNOSIS — L97821 Non-pressure chronic ulcer of other part of left lower leg limited to breakdown of skin: Secondary | ICD-10-CM | POA: Diagnosis not present

## 2020-07-14 DIAGNOSIS — I129 Hypertensive chronic kidney disease with stage 1 through stage 4 chronic kidney disease, or unspecified chronic kidney disease: Secondary | ICD-10-CM | POA: Diagnosis not present

## 2020-07-14 DIAGNOSIS — Z7984 Long term (current) use of oral hypoglycemic drugs: Secondary | ICD-10-CM | POA: Diagnosis not present

## 2020-07-14 DIAGNOSIS — E1122 Type 2 diabetes mellitus with diabetic chronic kidney disease: Secondary | ICD-10-CM | POA: Diagnosis not present

## 2020-07-14 DIAGNOSIS — I482 Chronic atrial fibrillation, unspecified: Secondary | ICD-10-CM | POA: Diagnosis not present

## 2020-07-14 DIAGNOSIS — E1142 Type 2 diabetes mellitus with diabetic polyneuropathy: Secondary | ICD-10-CM | POA: Diagnosis not present

## 2020-07-14 DIAGNOSIS — I87313 Chronic venous hypertension (idiopathic) with ulcer of bilateral lower extremity: Secondary | ICD-10-CM | POA: Diagnosis not present

## 2020-07-14 DIAGNOSIS — Z4800 Encounter for change or removal of nonsurgical wound dressing: Secondary | ICD-10-CM | POA: Diagnosis not present

## 2020-07-14 DIAGNOSIS — E1151 Type 2 diabetes mellitus with diabetic peripheral angiopathy without gangrene: Secondary | ICD-10-CM | POA: Diagnosis not present

## 2020-07-14 DIAGNOSIS — L97811 Non-pressure chronic ulcer of other part of right lower leg limited to breakdown of skin: Secondary | ICD-10-CM | POA: Diagnosis not present

## 2020-07-15 DIAGNOSIS — E1151 Type 2 diabetes mellitus with diabetic peripheral angiopathy without gangrene: Secondary | ICD-10-CM | POA: Diagnosis not present

## 2020-07-15 DIAGNOSIS — E1140 Type 2 diabetes mellitus with diabetic neuropathy, unspecified: Secondary | ICD-10-CM | POA: Diagnosis not present

## 2020-07-16 DIAGNOSIS — I129 Hypertensive chronic kidney disease with stage 1 through stage 4 chronic kidney disease, or unspecified chronic kidney disease: Secondary | ICD-10-CM | POA: Diagnosis not present

## 2020-07-16 DIAGNOSIS — I482 Chronic atrial fibrillation, unspecified: Secondary | ICD-10-CM | POA: Diagnosis not present

## 2020-07-16 DIAGNOSIS — E1151 Type 2 diabetes mellitus with diabetic peripheral angiopathy without gangrene: Secondary | ICD-10-CM | POA: Diagnosis not present

## 2020-07-16 DIAGNOSIS — E1122 Type 2 diabetes mellitus with diabetic chronic kidney disease: Secondary | ICD-10-CM | POA: Diagnosis not present

## 2020-07-16 DIAGNOSIS — I87313 Chronic venous hypertension (idiopathic) with ulcer of bilateral lower extremity: Secondary | ICD-10-CM | POA: Diagnosis not present

## 2020-07-16 DIAGNOSIS — L97821 Non-pressure chronic ulcer of other part of left lower leg limited to breakdown of skin: Secondary | ICD-10-CM | POA: Diagnosis not present

## 2020-07-16 DIAGNOSIS — Z7984 Long term (current) use of oral hypoglycemic drugs: Secondary | ICD-10-CM | POA: Diagnosis not present

## 2020-07-16 DIAGNOSIS — Z4800 Encounter for change or removal of nonsurgical wound dressing: Secondary | ICD-10-CM | POA: Diagnosis not present

## 2020-07-16 DIAGNOSIS — L97811 Non-pressure chronic ulcer of other part of right lower leg limited to breakdown of skin: Secondary | ICD-10-CM | POA: Diagnosis not present

## 2020-07-16 DIAGNOSIS — E1142 Type 2 diabetes mellitus with diabetic polyneuropathy: Secondary | ICD-10-CM | POA: Diagnosis not present

## 2020-07-18 DIAGNOSIS — E1142 Type 2 diabetes mellitus with diabetic polyneuropathy: Secondary | ICD-10-CM | POA: Diagnosis not present

## 2020-07-18 DIAGNOSIS — E7849 Other hyperlipidemia: Secondary | ICD-10-CM | POA: Diagnosis not present

## 2020-07-18 DIAGNOSIS — E039 Hypothyroidism, unspecified: Secondary | ICD-10-CM | POA: Diagnosis not present

## 2020-07-18 DIAGNOSIS — E1165 Type 2 diabetes mellitus with hyperglycemia: Secondary | ICD-10-CM | POA: Diagnosis not present

## 2020-07-18 DIAGNOSIS — N183 Chronic kidney disease, stage 3 unspecified: Secondary | ICD-10-CM | POA: Diagnosis not present

## 2020-07-18 DIAGNOSIS — K2100 Gastro-esophageal reflux disease with esophagitis, without bleeding: Secondary | ICD-10-CM | POA: Diagnosis not present

## 2020-07-18 DIAGNOSIS — E782 Mixed hyperlipidemia: Secondary | ICD-10-CM | POA: Diagnosis not present

## 2020-07-18 DIAGNOSIS — I10 Essential (primary) hypertension: Secondary | ICD-10-CM | POA: Diagnosis not present

## 2020-07-21 DIAGNOSIS — L97811 Non-pressure chronic ulcer of other part of right lower leg limited to breakdown of skin: Secondary | ICD-10-CM | POA: Diagnosis not present

## 2020-07-21 DIAGNOSIS — I129 Hypertensive chronic kidney disease with stage 1 through stage 4 chronic kidney disease, or unspecified chronic kidney disease: Secondary | ICD-10-CM | POA: Diagnosis not present

## 2020-07-21 DIAGNOSIS — E1151 Type 2 diabetes mellitus with diabetic peripheral angiopathy without gangrene: Secondary | ICD-10-CM | POA: Diagnosis not present

## 2020-07-21 DIAGNOSIS — E1122 Type 2 diabetes mellitus with diabetic chronic kidney disease: Secondary | ICD-10-CM | POA: Diagnosis not present

## 2020-07-21 DIAGNOSIS — Z4800 Encounter for change or removal of nonsurgical wound dressing: Secondary | ICD-10-CM | POA: Diagnosis not present

## 2020-07-21 DIAGNOSIS — L97821 Non-pressure chronic ulcer of other part of left lower leg limited to breakdown of skin: Secondary | ICD-10-CM | POA: Diagnosis not present

## 2020-07-21 DIAGNOSIS — I482 Chronic atrial fibrillation, unspecified: Secondary | ICD-10-CM | POA: Diagnosis not present

## 2020-07-21 DIAGNOSIS — E1142 Type 2 diabetes mellitus with diabetic polyneuropathy: Secondary | ICD-10-CM | POA: Diagnosis not present

## 2020-07-21 DIAGNOSIS — I87313 Chronic venous hypertension (idiopathic) with ulcer of bilateral lower extremity: Secondary | ICD-10-CM | POA: Diagnosis not present

## 2020-07-21 DIAGNOSIS — Z7984 Long term (current) use of oral hypoglycemic drugs: Secondary | ICD-10-CM | POA: Diagnosis not present

## 2020-07-23 DIAGNOSIS — I482 Chronic atrial fibrillation, unspecified: Secondary | ICD-10-CM | POA: Diagnosis not present

## 2020-07-23 DIAGNOSIS — L97811 Non-pressure chronic ulcer of other part of right lower leg limited to breakdown of skin: Secondary | ICD-10-CM | POA: Diagnosis not present

## 2020-07-23 DIAGNOSIS — I10 Essential (primary) hypertension: Secondary | ICD-10-CM | POA: Diagnosis not present

## 2020-07-23 DIAGNOSIS — Z7984 Long term (current) use of oral hypoglycemic drugs: Secondary | ICD-10-CM | POA: Diagnosis not present

## 2020-07-23 DIAGNOSIS — I8392 Asymptomatic varicose veins of left lower extremity: Secondary | ICD-10-CM | POA: Diagnosis not present

## 2020-07-23 DIAGNOSIS — G629 Polyneuropathy, unspecified: Secondary | ICD-10-CM | POA: Diagnosis not present

## 2020-07-23 DIAGNOSIS — E7849 Other hyperlipidemia: Secondary | ICD-10-CM | POA: Diagnosis not present

## 2020-07-23 DIAGNOSIS — Z4800 Encounter for change or removal of nonsurgical wound dressing: Secondary | ICD-10-CM | POA: Diagnosis not present

## 2020-07-23 DIAGNOSIS — E1151 Type 2 diabetes mellitus with diabetic peripheral angiopathy without gangrene: Secondary | ICD-10-CM | POA: Diagnosis not present

## 2020-07-23 DIAGNOSIS — E1142 Type 2 diabetes mellitus with diabetic polyneuropathy: Secondary | ICD-10-CM | POA: Diagnosis not present

## 2020-07-23 DIAGNOSIS — Z0001 Encounter for general adult medical examination with abnormal findings: Secondary | ICD-10-CM | POA: Diagnosis not present

## 2020-07-23 DIAGNOSIS — I129 Hypertensive chronic kidney disease with stage 1 through stage 4 chronic kidney disease, or unspecified chronic kidney disease: Secondary | ICD-10-CM | POA: Diagnosis not present

## 2020-07-23 DIAGNOSIS — E538 Deficiency of other specified B group vitamins: Secondary | ICD-10-CM | POA: Diagnosis not present

## 2020-07-23 DIAGNOSIS — L97821 Non-pressure chronic ulcer of other part of left lower leg limited to breakdown of skin: Secondary | ICD-10-CM | POA: Diagnosis not present

## 2020-07-23 DIAGNOSIS — E1122 Type 2 diabetes mellitus with diabetic chronic kidney disease: Secondary | ICD-10-CM | POA: Diagnosis not present

## 2020-07-23 DIAGNOSIS — L97909 Non-pressure chronic ulcer of unspecified part of unspecified lower leg with unspecified severity: Secondary | ICD-10-CM | POA: Diagnosis not present

## 2020-07-23 DIAGNOSIS — I87313 Chronic venous hypertension (idiopathic) with ulcer of bilateral lower extremity: Secondary | ICD-10-CM | POA: Diagnosis not present

## 2020-07-28 DIAGNOSIS — I129 Hypertensive chronic kidney disease with stage 1 through stage 4 chronic kidney disease, or unspecified chronic kidney disease: Secondary | ICD-10-CM | POA: Diagnosis not present

## 2020-07-28 DIAGNOSIS — Z4800 Encounter for change or removal of nonsurgical wound dressing: Secondary | ICD-10-CM | POA: Diagnosis not present

## 2020-07-28 DIAGNOSIS — I482 Chronic atrial fibrillation, unspecified: Secondary | ICD-10-CM | POA: Diagnosis not present

## 2020-07-28 DIAGNOSIS — E1142 Type 2 diabetes mellitus with diabetic polyneuropathy: Secondary | ICD-10-CM | POA: Diagnosis not present

## 2020-07-28 DIAGNOSIS — E1122 Type 2 diabetes mellitus with diabetic chronic kidney disease: Secondary | ICD-10-CM | POA: Diagnosis not present

## 2020-07-28 DIAGNOSIS — L97811 Non-pressure chronic ulcer of other part of right lower leg limited to breakdown of skin: Secondary | ICD-10-CM | POA: Diagnosis not present

## 2020-07-28 DIAGNOSIS — L97821 Non-pressure chronic ulcer of other part of left lower leg limited to breakdown of skin: Secondary | ICD-10-CM | POA: Diagnosis not present

## 2020-07-28 DIAGNOSIS — E1151 Type 2 diabetes mellitus with diabetic peripheral angiopathy without gangrene: Secondary | ICD-10-CM | POA: Diagnosis not present

## 2020-07-28 DIAGNOSIS — I87313 Chronic venous hypertension (idiopathic) with ulcer of bilateral lower extremity: Secondary | ICD-10-CM | POA: Diagnosis not present

## 2020-07-28 DIAGNOSIS — Z7984 Long term (current) use of oral hypoglycemic drugs: Secondary | ICD-10-CM | POA: Diagnosis not present

## 2020-07-31 DIAGNOSIS — Z4800 Encounter for change or removal of nonsurgical wound dressing: Secondary | ICD-10-CM | POA: Diagnosis not present

## 2020-07-31 DIAGNOSIS — L97811 Non-pressure chronic ulcer of other part of right lower leg limited to breakdown of skin: Secondary | ICD-10-CM | POA: Diagnosis not present

## 2020-07-31 DIAGNOSIS — E1122 Type 2 diabetes mellitus with diabetic chronic kidney disease: Secondary | ICD-10-CM | POA: Diagnosis not present

## 2020-07-31 DIAGNOSIS — E1142 Type 2 diabetes mellitus with diabetic polyneuropathy: Secondary | ICD-10-CM | POA: Diagnosis not present

## 2020-07-31 DIAGNOSIS — Z7984 Long term (current) use of oral hypoglycemic drugs: Secondary | ICD-10-CM | POA: Diagnosis not present

## 2020-07-31 DIAGNOSIS — L97821 Non-pressure chronic ulcer of other part of left lower leg limited to breakdown of skin: Secondary | ICD-10-CM | POA: Diagnosis not present

## 2020-07-31 DIAGNOSIS — I87313 Chronic venous hypertension (idiopathic) with ulcer of bilateral lower extremity: Secondary | ICD-10-CM | POA: Diagnosis not present

## 2020-07-31 DIAGNOSIS — I482 Chronic atrial fibrillation, unspecified: Secondary | ICD-10-CM | POA: Diagnosis not present

## 2020-07-31 DIAGNOSIS — I129 Hypertensive chronic kidney disease with stage 1 through stage 4 chronic kidney disease, or unspecified chronic kidney disease: Secondary | ICD-10-CM | POA: Diagnosis not present

## 2020-07-31 DIAGNOSIS — E1151 Type 2 diabetes mellitus with diabetic peripheral angiopathy without gangrene: Secondary | ICD-10-CM | POA: Diagnosis not present

## 2020-08-05 DIAGNOSIS — E1122 Type 2 diabetes mellitus with diabetic chronic kidney disease: Secondary | ICD-10-CM | POA: Diagnosis not present

## 2020-08-05 DIAGNOSIS — I87313 Chronic venous hypertension (idiopathic) with ulcer of bilateral lower extremity: Secondary | ICD-10-CM | POA: Diagnosis not present

## 2020-08-05 DIAGNOSIS — Z7984 Long term (current) use of oral hypoglycemic drugs: Secondary | ICD-10-CM | POA: Diagnosis not present

## 2020-08-05 DIAGNOSIS — Z4800 Encounter for change or removal of nonsurgical wound dressing: Secondary | ICD-10-CM | POA: Diagnosis not present

## 2020-08-05 DIAGNOSIS — L97821 Non-pressure chronic ulcer of other part of left lower leg limited to breakdown of skin: Secondary | ICD-10-CM | POA: Diagnosis not present

## 2020-08-05 DIAGNOSIS — E1142 Type 2 diabetes mellitus with diabetic polyneuropathy: Secondary | ICD-10-CM | POA: Diagnosis not present

## 2020-08-05 DIAGNOSIS — E1151 Type 2 diabetes mellitus with diabetic peripheral angiopathy without gangrene: Secondary | ICD-10-CM | POA: Diagnosis not present

## 2020-08-05 DIAGNOSIS — L97811 Non-pressure chronic ulcer of other part of right lower leg limited to breakdown of skin: Secondary | ICD-10-CM | POA: Diagnosis not present

## 2020-08-05 DIAGNOSIS — I129 Hypertensive chronic kidney disease with stage 1 through stage 4 chronic kidney disease, or unspecified chronic kidney disease: Secondary | ICD-10-CM | POA: Diagnosis not present

## 2020-08-05 DIAGNOSIS — I482 Chronic atrial fibrillation, unspecified: Secondary | ICD-10-CM | POA: Diagnosis not present

## 2020-08-06 ENCOUNTER — Other Ambulatory Visit: Payer: Self-pay | Admitting: Family Medicine

## 2020-08-06 DIAGNOSIS — Z1231 Encounter for screening mammogram for malignant neoplasm of breast: Secondary | ICD-10-CM

## 2020-08-07 DIAGNOSIS — I129 Hypertensive chronic kidney disease with stage 1 through stage 4 chronic kidney disease, or unspecified chronic kidney disease: Secondary | ICD-10-CM | POA: Diagnosis not present

## 2020-08-07 DIAGNOSIS — Z7984 Long term (current) use of oral hypoglycemic drugs: Secondary | ICD-10-CM | POA: Diagnosis not present

## 2020-08-07 DIAGNOSIS — E1151 Type 2 diabetes mellitus with diabetic peripheral angiopathy without gangrene: Secondary | ICD-10-CM | POA: Diagnosis not present

## 2020-08-07 DIAGNOSIS — E1142 Type 2 diabetes mellitus with diabetic polyneuropathy: Secondary | ICD-10-CM | POA: Diagnosis not present

## 2020-08-07 DIAGNOSIS — L97821 Non-pressure chronic ulcer of other part of left lower leg limited to breakdown of skin: Secondary | ICD-10-CM | POA: Diagnosis not present

## 2020-08-07 DIAGNOSIS — L97811 Non-pressure chronic ulcer of other part of right lower leg limited to breakdown of skin: Secondary | ICD-10-CM | POA: Diagnosis not present

## 2020-08-07 DIAGNOSIS — I87313 Chronic venous hypertension (idiopathic) with ulcer of bilateral lower extremity: Secondary | ICD-10-CM | POA: Diagnosis not present

## 2020-08-07 DIAGNOSIS — I482 Chronic atrial fibrillation, unspecified: Secondary | ICD-10-CM | POA: Diagnosis not present

## 2020-08-07 DIAGNOSIS — Z4800 Encounter for change or removal of nonsurgical wound dressing: Secondary | ICD-10-CM | POA: Diagnosis not present

## 2020-08-07 DIAGNOSIS — E1122 Type 2 diabetes mellitus with diabetic chronic kidney disease: Secondary | ICD-10-CM | POA: Diagnosis not present

## 2020-08-11 DIAGNOSIS — Z7984 Long term (current) use of oral hypoglycemic drugs: Secondary | ICD-10-CM | POA: Diagnosis not present

## 2020-08-11 DIAGNOSIS — I87313 Chronic venous hypertension (idiopathic) with ulcer of bilateral lower extremity: Secondary | ICD-10-CM | POA: Diagnosis not present

## 2020-08-11 DIAGNOSIS — E1142 Type 2 diabetes mellitus with diabetic polyneuropathy: Secondary | ICD-10-CM | POA: Diagnosis not present

## 2020-08-11 DIAGNOSIS — I129 Hypertensive chronic kidney disease with stage 1 through stage 4 chronic kidney disease, or unspecified chronic kidney disease: Secondary | ICD-10-CM | POA: Diagnosis not present

## 2020-08-11 DIAGNOSIS — E1151 Type 2 diabetes mellitus with diabetic peripheral angiopathy without gangrene: Secondary | ICD-10-CM | POA: Diagnosis not present

## 2020-08-11 DIAGNOSIS — L97811 Non-pressure chronic ulcer of other part of right lower leg limited to breakdown of skin: Secondary | ICD-10-CM | POA: Diagnosis not present

## 2020-08-11 DIAGNOSIS — L97821 Non-pressure chronic ulcer of other part of left lower leg limited to breakdown of skin: Secondary | ICD-10-CM | POA: Diagnosis not present

## 2020-08-11 DIAGNOSIS — I482 Chronic atrial fibrillation, unspecified: Secondary | ICD-10-CM | POA: Diagnosis not present

## 2020-08-11 DIAGNOSIS — Z4800 Encounter for change or removal of nonsurgical wound dressing: Secondary | ICD-10-CM | POA: Diagnosis not present

## 2020-08-11 DIAGNOSIS — E1122 Type 2 diabetes mellitus with diabetic chronic kidney disease: Secondary | ICD-10-CM | POA: Diagnosis not present

## 2020-08-12 ENCOUNTER — Other Ambulatory Visit: Payer: Self-pay

## 2020-08-12 ENCOUNTER — Ambulatory Visit
Admission: RE | Admit: 2020-08-12 | Discharge: 2020-08-12 | Disposition: A | Discharge status: Home / Self Care | Payer: Medicare Other | Source: Ambulatory Visit | Attending: Family Medicine | Admitting: Family Medicine

## 2020-08-12 DIAGNOSIS — Z1231 Encounter for screening mammogram for malignant neoplasm of breast: Secondary | ICD-10-CM

## 2020-08-13 DIAGNOSIS — E1142 Type 2 diabetes mellitus with diabetic polyneuropathy: Secondary | ICD-10-CM | POA: Diagnosis not present

## 2020-08-13 DIAGNOSIS — L97821 Non-pressure chronic ulcer of other part of left lower leg limited to breakdown of skin: Secondary | ICD-10-CM | POA: Diagnosis not present

## 2020-08-13 DIAGNOSIS — Z7984 Long term (current) use of oral hypoglycemic drugs: Secondary | ICD-10-CM | POA: Diagnosis not present

## 2020-08-13 DIAGNOSIS — E1122 Type 2 diabetes mellitus with diabetic chronic kidney disease: Secondary | ICD-10-CM | POA: Diagnosis not present

## 2020-08-13 DIAGNOSIS — L97811 Non-pressure chronic ulcer of other part of right lower leg limited to breakdown of skin: Secondary | ICD-10-CM | POA: Diagnosis not present

## 2020-08-13 DIAGNOSIS — I87313 Chronic venous hypertension (idiopathic) with ulcer of bilateral lower extremity: Secondary | ICD-10-CM | POA: Diagnosis not present

## 2020-08-13 DIAGNOSIS — I482 Chronic atrial fibrillation, unspecified: Secondary | ICD-10-CM | POA: Diagnosis not present

## 2020-08-13 DIAGNOSIS — Z4800 Encounter for change or removal of nonsurgical wound dressing: Secondary | ICD-10-CM | POA: Diagnosis not present

## 2020-08-13 DIAGNOSIS — E1151 Type 2 diabetes mellitus with diabetic peripheral angiopathy without gangrene: Secondary | ICD-10-CM | POA: Diagnosis not present

## 2020-08-13 DIAGNOSIS — I129 Hypertensive chronic kidney disease with stage 1 through stage 4 chronic kidney disease, or unspecified chronic kidney disease: Secondary | ICD-10-CM | POA: Diagnosis not present

## 2020-08-15 DIAGNOSIS — E1151 Type 2 diabetes mellitus with diabetic peripheral angiopathy without gangrene: Secondary | ICD-10-CM | POA: Diagnosis not present

## 2020-08-15 DIAGNOSIS — E1122 Type 2 diabetes mellitus with diabetic chronic kidney disease: Secondary | ICD-10-CM | POA: Diagnosis not present

## 2020-08-15 DIAGNOSIS — I482 Chronic atrial fibrillation, unspecified: Secondary | ICD-10-CM | POA: Diagnosis not present

## 2020-08-15 DIAGNOSIS — L97811 Non-pressure chronic ulcer of other part of right lower leg limited to breakdown of skin: Secondary | ICD-10-CM | POA: Diagnosis not present

## 2020-08-15 DIAGNOSIS — Z7984 Long term (current) use of oral hypoglycemic drugs: Secondary | ICD-10-CM | POA: Diagnosis not present

## 2020-08-15 DIAGNOSIS — E1142 Type 2 diabetes mellitus with diabetic polyneuropathy: Secondary | ICD-10-CM | POA: Diagnosis not present

## 2020-08-15 DIAGNOSIS — I129 Hypertensive chronic kidney disease with stage 1 through stage 4 chronic kidney disease, or unspecified chronic kidney disease: Secondary | ICD-10-CM | POA: Diagnosis not present

## 2020-08-15 DIAGNOSIS — Z4800 Encounter for change or removal of nonsurgical wound dressing: Secondary | ICD-10-CM | POA: Diagnosis not present

## 2020-08-15 DIAGNOSIS — L97821 Non-pressure chronic ulcer of other part of left lower leg limited to breakdown of skin: Secondary | ICD-10-CM | POA: Diagnosis not present

## 2020-08-15 DIAGNOSIS — I87313 Chronic venous hypertension (idiopathic) with ulcer of bilateral lower extremity: Secondary | ICD-10-CM | POA: Diagnosis not present

## 2020-08-18 DIAGNOSIS — E1151 Type 2 diabetes mellitus with diabetic peripheral angiopathy without gangrene: Secondary | ICD-10-CM | POA: Diagnosis not present

## 2020-08-18 DIAGNOSIS — I482 Chronic atrial fibrillation, unspecified: Secondary | ICD-10-CM | POA: Diagnosis not present

## 2020-08-18 DIAGNOSIS — E1142 Type 2 diabetes mellitus with diabetic polyneuropathy: Secondary | ICD-10-CM | POA: Diagnosis not present

## 2020-08-18 DIAGNOSIS — L97821 Non-pressure chronic ulcer of other part of left lower leg limited to breakdown of skin: Secondary | ICD-10-CM | POA: Diagnosis not present

## 2020-08-18 DIAGNOSIS — L97811 Non-pressure chronic ulcer of other part of right lower leg limited to breakdown of skin: Secondary | ICD-10-CM | POA: Diagnosis not present

## 2020-08-18 DIAGNOSIS — I129 Hypertensive chronic kidney disease with stage 1 through stage 4 chronic kidney disease, or unspecified chronic kidney disease: Secondary | ICD-10-CM | POA: Diagnosis not present

## 2020-08-18 DIAGNOSIS — Z7984 Long term (current) use of oral hypoglycemic drugs: Secondary | ICD-10-CM | POA: Diagnosis not present

## 2020-08-18 DIAGNOSIS — I87313 Chronic venous hypertension (idiopathic) with ulcer of bilateral lower extremity: Secondary | ICD-10-CM | POA: Diagnosis not present

## 2020-08-18 DIAGNOSIS — Z4800 Encounter for change or removal of nonsurgical wound dressing: Secondary | ICD-10-CM | POA: Diagnosis not present

## 2020-08-18 DIAGNOSIS — E1122 Type 2 diabetes mellitus with diabetic chronic kidney disease: Secondary | ICD-10-CM | POA: Diagnosis not present

## 2020-08-19 DIAGNOSIS — E113293 Type 2 diabetes mellitus with mild nonproliferative diabetic retinopathy without macular edema, bilateral: Secondary | ICD-10-CM | POA: Diagnosis not present

## 2020-08-19 DIAGNOSIS — E1165 Type 2 diabetes mellitus with hyperglycemia: Secondary | ICD-10-CM | POA: Diagnosis not present

## 2020-08-19 DIAGNOSIS — R300 Dysuria: Secondary | ICD-10-CM | POA: Diagnosis not present

## 2020-08-19 DIAGNOSIS — M545 Low back pain, unspecified: Secondary | ICD-10-CM | POA: Diagnosis not present

## 2020-08-20 DIAGNOSIS — Z7984 Long term (current) use of oral hypoglycemic drugs: Secondary | ICD-10-CM | POA: Diagnosis not present

## 2020-08-20 DIAGNOSIS — L97821 Non-pressure chronic ulcer of other part of left lower leg limited to breakdown of skin: Secondary | ICD-10-CM | POA: Diagnosis not present

## 2020-08-20 DIAGNOSIS — L97811 Non-pressure chronic ulcer of other part of right lower leg limited to breakdown of skin: Secondary | ICD-10-CM | POA: Diagnosis not present

## 2020-08-20 DIAGNOSIS — N183 Chronic kidney disease, stage 3 unspecified: Secondary | ICD-10-CM | POA: Diagnosis not present

## 2020-08-20 DIAGNOSIS — E1142 Type 2 diabetes mellitus with diabetic polyneuropathy: Secondary | ICD-10-CM | POA: Diagnosis not present

## 2020-08-20 DIAGNOSIS — I129 Hypertensive chronic kidney disease with stage 1 through stage 4 chronic kidney disease, or unspecified chronic kidney disease: Secondary | ICD-10-CM | POA: Diagnosis not present

## 2020-08-20 DIAGNOSIS — Z4800 Encounter for change or removal of nonsurgical wound dressing: Secondary | ICD-10-CM | POA: Diagnosis not present

## 2020-08-20 DIAGNOSIS — I482 Chronic atrial fibrillation, unspecified: Secondary | ICD-10-CM | POA: Diagnosis not present

## 2020-08-20 DIAGNOSIS — I87313 Chronic venous hypertension (idiopathic) with ulcer of bilateral lower extremity: Secondary | ICD-10-CM | POA: Diagnosis not present

## 2020-08-20 DIAGNOSIS — E1151 Type 2 diabetes mellitus with diabetic peripheral angiopathy without gangrene: Secondary | ICD-10-CM | POA: Diagnosis not present

## 2020-08-20 DIAGNOSIS — E1122 Type 2 diabetes mellitus with diabetic chronic kidney disease: Secondary | ICD-10-CM | POA: Diagnosis not present

## 2020-08-25 DIAGNOSIS — Z4800 Encounter for change or removal of nonsurgical wound dressing: Secondary | ICD-10-CM | POA: Diagnosis not present

## 2020-08-25 DIAGNOSIS — I129 Hypertensive chronic kidney disease with stage 1 through stage 4 chronic kidney disease, or unspecified chronic kidney disease: Secondary | ICD-10-CM | POA: Diagnosis not present

## 2020-08-25 DIAGNOSIS — E1142 Type 2 diabetes mellitus with diabetic polyneuropathy: Secondary | ICD-10-CM | POA: Diagnosis not present

## 2020-08-25 DIAGNOSIS — L97811 Non-pressure chronic ulcer of other part of right lower leg limited to breakdown of skin: Secondary | ICD-10-CM | POA: Diagnosis not present

## 2020-08-25 DIAGNOSIS — I87313 Chronic venous hypertension (idiopathic) with ulcer of bilateral lower extremity: Secondary | ICD-10-CM | POA: Diagnosis not present

## 2020-08-25 DIAGNOSIS — N183 Chronic kidney disease, stage 3 unspecified: Secondary | ICD-10-CM | POA: Diagnosis not present

## 2020-08-25 DIAGNOSIS — L97821 Non-pressure chronic ulcer of other part of left lower leg limited to breakdown of skin: Secondary | ICD-10-CM | POA: Diagnosis not present

## 2020-08-25 DIAGNOSIS — I482 Chronic atrial fibrillation, unspecified: Secondary | ICD-10-CM | POA: Diagnosis not present

## 2020-08-25 DIAGNOSIS — E1122 Type 2 diabetes mellitus with diabetic chronic kidney disease: Secondary | ICD-10-CM | POA: Diagnosis not present

## 2020-08-25 DIAGNOSIS — E1151 Type 2 diabetes mellitus with diabetic peripheral angiopathy without gangrene: Secondary | ICD-10-CM | POA: Diagnosis not present

## 2020-08-25 DIAGNOSIS — Z7984 Long term (current) use of oral hypoglycemic drugs: Secondary | ICD-10-CM | POA: Diagnosis not present

## 2020-08-27 DIAGNOSIS — I87313 Chronic venous hypertension (idiopathic) with ulcer of bilateral lower extremity: Secondary | ICD-10-CM | POA: Diagnosis not present

## 2020-08-27 DIAGNOSIS — E1142 Type 2 diabetes mellitus with diabetic polyneuropathy: Secondary | ICD-10-CM | POA: Diagnosis not present

## 2020-08-27 DIAGNOSIS — Z4800 Encounter for change or removal of nonsurgical wound dressing: Secondary | ICD-10-CM | POA: Diagnosis not present

## 2020-08-27 DIAGNOSIS — I482 Chronic atrial fibrillation, unspecified: Secondary | ICD-10-CM | POA: Diagnosis not present

## 2020-08-27 DIAGNOSIS — Z7984 Long term (current) use of oral hypoglycemic drugs: Secondary | ICD-10-CM | POA: Diagnosis not present

## 2020-08-27 DIAGNOSIS — N183 Chronic kidney disease, stage 3 unspecified: Secondary | ICD-10-CM | POA: Diagnosis not present

## 2020-08-27 DIAGNOSIS — E1122 Type 2 diabetes mellitus with diabetic chronic kidney disease: Secondary | ICD-10-CM | POA: Diagnosis not present

## 2020-08-27 DIAGNOSIS — L97811 Non-pressure chronic ulcer of other part of right lower leg limited to breakdown of skin: Secondary | ICD-10-CM | POA: Diagnosis not present

## 2020-08-27 DIAGNOSIS — L97821 Non-pressure chronic ulcer of other part of left lower leg limited to breakdown of skin: Secondary | ICD-10-CM | POA: Diagnosis not present

## 2020-08-27 DIAGNOSIS — I129 Hypertensive chronic kidney disease with stage 1 through stage 4 chronic kidney disease, or unspecified chronic kidney disease: Secondary | ICD-10-CM | POA: Diagnosis not present

## 2020-08-27 DIAGNOSIS — E1151 Type 2 diabetes mellitus with diabetic peripheral angiopathy without gangrene: Secondary | ICD-10-CM | POA: Diagnosis not present

## 2020-08-29 DIAGNOSIS — N183 Chronic kidney disease, stage 3 unspecified: Secondary | ICD-10-CM | POA: Diagnosis not present

## 2020-08-29 DIAGNOSIS — I87313 Chronic venous hypertension (idiopathic) with ulcer of bilateral lower extremity: Secondary | ICD-10-CM | POA: Diagnosis not present

## 2020-08-29 DIAGNOSIS — Z7984 Long term (current) use of oral hypoglycemic drugs: Secondary | ICD-10-CM | POA: Diagnosis not present

## 2020-08-29 DIAGNOSIS — L97811 Non-pressure chronic ulcer of other part of right lower leg limited to breakdown of skin: Secondary | ICD-10-CM | POA: Diagnosis not present

## 2020-08-29 DIAGNOSIS — I129 Hypertensive chronic kidney disease with stage 1 through stage 4 chronic kidney disease, or unspecified chronic kidney disease: Secondary | ICD-10-CM | POA: Diagnosis not present

## 2020-08-29 DIAGNOSIS — Z4800 Encounter for change or removal of nonsurgical wound dressing: Secondary | ICD-10-CM | POA: Diagnosis not present

## 2020-08-29 DIAGNOSIS — E1151 Type 2 diabetes mellitus with diabetic peripheral angiopathy without gangrene: Secondary | ICD-10-CM | POA: Diagnosis not present

## 2020-08-29 DIAGNOSIS — L97821 Non-pressure chronic ulcer of other part of left lower leg limited to breakdown of skin: Secondary | ICD-10-CM | POA: Diagnosis not present

## 2020-08-29 DIAGNOSIS — E1142 Type 2 diabetes mellitus with diabetic polyneuropathy: Secondary | ICD-10-CM | POA: Diagnosis not present

## 2020-08-29 DIAGNOSIS — E1122 Type 2 diabetes mellitus with diabetic chronic kidney disease: Secondary | ICD-10-CM | POA: Diagnosis not present

## 2020-08-29 DIAGNOSIS — I482 Chronic atrial fibrillation, unspecified: Secondary | ICD-10-CM | POA: Diagnosis not present

## 2020-09-01 DIAGNOSIS — E1122 Type 2 diabetes mellitus with diabetic chronic kidney disease: Secondary | ICD-10-CM | POA: Diagnosis not present

## 2020-09-01 DIAGNOSIS — I129 Hypertensive chronic kidney disease with stage 1 through stage 4 chronic kidney disease, or unspecified chronic kidney disease: Secondary | ICD-10-CM | POA: Diagnosis not present

## 2020-09-01 DIAGNOSIS — I482 Chronic atrial fibrillation, unspecified: Secondary | ICD-10-CM | POA: Diagnosis not present

## 2020-09-01 DIAGNOSIS — L97821 Non-pressure chronic ulcer of other part of left lower leg limited to breakdown of skin: Secondary | ICD-10-CM | POA: Diagnosis not present

## 2020-09-01 DIAGNOSIS — Z4800 Encounter for change or removal of nonsurgical wound dressing: Secondary | ICD-10-CM | POA: Diagnosis not present

## 2020-09-01 DIAGNOSIS — Z7984 Long term (current) use of oral hypoglycemic drugs: Secondary | ICD-10-CM | POA: Diagnosis not present

## 2020-09-01 DIAGNOSIS — E1142 Type 2 diabetes mellitus with diabetic polyneuropathy: Secondary | ICD-10-CM | POA: Diagnosis not present

## 2020-09-01 DIAGNOSIS — L97811 Non-pressure chronic ulcer of other part of right lower leg limited to breakdown of skin: Secondary | ICD-10-CM | POA: Diagnosis not present

## 2020-09-01 DIAGNOSIS — N183 Chronic kidney disease, stage 3 unspecified: Secondary | ICD-10-CM | POA: Diagnosis not present

## 2020-09-01 DIAGNOSIS — E1151 Type 2 diabetes mellitus with diabetic peripheral angiopathy without gangrene: Secondary | ICD-10-CM | POA: Diagnosis not present

## 2020-09-01 DIAGNOSIS — I87313 Chronic venous hypertension (idiopathic) with ulcer of bilateral lower extremity: Secondary | ICD-10-CM | POA: Diagnosis not present

## 2020-09-02 DIAGNOSIS — R300 Dysuria: Secondary | ICD-10-CM | POA: Diagnosis not present

## 2020-09-03 DIAGNOSIS — E1142 Type 2 diabetes mellitus with diabetic polyneuropathy: Secondary | ICD-10-CM | POA: Diagnosis not present

## 2020-09-03 DIAGNOSIS — Z7984 Long term (current) use of oral hypoglycemic drugs: Secondary | ICD-10-CM | POA: Diagnosis not present

## 2020-09-03 DIAGNOSIS — I482 Chronic atrial fibrillation, unspecified: Secondary | ICD-10-CM | POA: Diagnosis not present

## 2020-09-03 DIAGNOSIS — E1151 Type 2 diabetes mellitus with diabetic peripheral angiopathy without gangrene: Secondary | ICD-10-CM | POA: Diagnosis not present

## 2020-09-03 DIAGNOSIS — E1122 Type 2 diabetes mellitus with diabetic chronic kidney disease: Secondary | ICD-10-CM | POA: Diagnosis not present

## 2020-09-03 DIAGNOSIS — I87313 Chronic venous hypertension (idiopathic) with ulcer of bilateral lower extremity: Secondary | ICD-10-CM | POA: Diagnosis not present

## 2020-09-03 DIAGNOSIS — R300 Dysuria: Secondary | ICD-10-CM | POA: Diagnosis not present

## 2020-09-03 DIAGNOSIS — Z4800 Encounter for change or removal of nonsurgical wound dressing: Secondary | ICD-10-CM | POA: Diagnosis not present

## 2020-09-03 DIAGNOSIS — N183 Chronic kidney disease, stage 3 unspecified: Secondary | ICD-10-CM | POA: Diagnosis not present

## 2020-09-03 DIAGNOSIS — I129 Hypertensive chronic kidney disease with stage 1 through stage 4 chronic kidney disease, or unspecified chronic kidney disease: Secondary | ICD-10-CM | POA: Diagnosis not present

## 2020-09-03 DIAGNOSIS — L97811 Non-pressure chronic ulcer of other part of right lower leg limited to breakdown of skin: Secondary | ICD-10-CM | POA: Diagnosis not present

## 2020-09-03 DIAGNOSIS — L97821 Non-pressure chronic ulcer of other part of left lower leg limited to breakdown of skin: Secondary | ICD-10-CM | POA: Diagnosis not present

## 2020-09-05 DIAGNOSIS — L97811 Non-pressure chronic ulcer of other part of right lower leg limited to breakdown of skin: Secondary | ICD-10-CM | POA: Diagnosis not present

## 2020-09-05 DIAGNOSIS — E1151 Type 2 diabetes mellitus with diabetic peripheral angiopathy without gangrene: Secondary | ICD-10-CM | POA: Diagnosis not present

## 2020-09-05 DIAGNOSIS — I87313 Chronic venous hypertension (idiopathic) with ulcer of bilateral lower extremity: Secondary | ICD-10-CM | POA: Diagnosis not present

## 2020-09-05 DIAGNOSIS — L97821 Non-pressure chronic ulcer of other part of left lower leg limited to breakdown of skin: Secondary | ICD-10-CM | POA: Diagnosis not present

## 2020-09-05 DIAGNOSIS — I482 Chronic atrial fibrillation, unspecified: Secondary | ICD-10-CM | POA: Diagnosis not present

## 2020-09-05 DIAGNOSIS — E1122 Type 2 diabetes mellitus with diabetic chronic kidney disease: Secondary | ICD-10-CM | POA: Diagnosis not present

## 2020-09-05 DIAGNOSIS — I129 Hypertensive chronic kidney disease with stage 1 through stage 4 chronic kidney disease, or unspecified chronic kidney disease: Secondary | ICD-10-CM | POA: Diagnosis not present

## 2020-09-05 DIAGNOSIS — Z7984 Long term (current) use of oral hypoglycemic drugs: Secondary | ICD-10-CM | POA: Diagnosis not present

## 2020-09-05 DIAGNOSIS — N183 Chronic kidney disease, stage 3 unspecified: Secondary | ICD-10-CM | POA: Diagnosis not present

## 2020-09-05 DIAGNOSIS — E1142 Type 2 diabetes mellitus with diabetic polyneuropathy: Secondary | ICD-10-CM | POA: Diagnosis not present

## 2020-09-05 DIAGNOSIS — Z4800 Encounter for change or removal of nonsurgical wound dressing: Secondary | ICD-10-CM | POA: Diagnosis not present

## 2020-09-08 DIAGNOSIS — Z7984 Long term (current) use of oral hypoglycemic drugs: Secondary | ICD-10-CM | POA: Diagnosis not present

## 2020-09-08 DIAGNOSIS — L97821 Non-pressure chronic ulcer of other part of left lower leg limited to breakdown of skin: Secondary | ICD-10-CM | POA: Diagnosis not present

## 2020-09-08 DIAGNOSIS — L97811 Non-pressure chronic ulcer of other part of right lower leg limited to breakdown of skin: Secondary | ICD-10-CM | POA: Diagnosis not present

## 2020-09-08 DIAGNOSIS — I482 Chronic atrial fibrillation, unspecified: Secondary | ICD-10-CM | POA: Diagnosis not present

## 2020-09-08 DIAGNOSIS — E1142 Type 2 diabetes mellitus with diabetic polyneuropathy: Secondary | ICD-10-CM | POA: Diagnosis not present

## 2020-09-08 DIAGNOSIS — E1122 Type 2 diabetes mellitus with diabetic chronic kidney disease: Secondary | ICD-10-CM | POA: Diagnosis not present

## 2020-09-08 DIAGNOSIS — N183 Chronic kidney disease, stage 3 unspecified: Secondary | ICD-10-CM | POA: Diagnosis not present

## 2020-09-08 DIAGNOSIS — I87313 Chronic venous hypertension (idiopathic) with ulcer of bilateral lower extremity: Secondary | ICD-10-CM | POA: Diagnosis not present

## 2020-09-08 DIAGNOSIS — Z4800 Encounter for change or removal of nonsurgical wound dressing: Secondary | ICD-10-CM | POA: Diagnosis not present

## 2020-09-08 DIAGNOSIS — E1151 Type 2 diabetes mellitus with diabetic peripheral angiopathy without gangrene: Secondary | ICD-10-CM | POA: Diagnosis not present

## 2020-09-08 DIAGNOSIS — I129 Hypertensive chronic kidney disease with stage 1 through stage 4 chronic kidney disease, or unspecified chronic kidney disease: Secondary | ICD-10-CM | POA: Diagnosis not present

## 2020-09-09 DIAGNOSIS — E1122 Type 2 diabetes mellitus with diabetic chronic kidney disease: Secondary | ICD-10-CM | POA: Diagnosis not present

## 2020-09-09 DIAGNOSIS — Z4800 Encounter for change or removal of nonsurgical wound dressing: Secondary | ICD-10-CM | POA: Diagnosis not present

## 2020-09-09 DIAGNOSIS — E1151 Type 2 diabetes mellitus with diabetic peripheral angiopathy without gangrene: Secondary | ICD-10-CM | POA: Diagnosis not present

## 2020-09-09 DIAGNOSIS — I129 Hypertensive chronic kidney disease with stage 1 through stage 4 chronic kidney disease, or unspecified chronic kidney disease: Secondary | ICD-10-CM | POA: Diagnosis not present

## 2020-09-09 DIAGNOSIS — L97811 Non-pressure chronic ulcer of other part of right lower leg limited to breakdown of skin: Secondary | ICD-10-CM | POA: Diagnosis not present

## 2020-09-09 DIAGNOSIS — I482 Chronic atrial fibrillation, unspecified: Secondary | ICD-10-CM | POA: Diagnosis not present

## 2020-09-09 DIAGNOSIS — L97821 Non-pressure chronic ulcer of other part of left lower leg limited to breakdown of skin: Secondary | ICD-10-CM | POA: Diagnosis not present

## 2020-09-09 DIAGNOSIS — I87313 Chronic venous hypertension (idiopathic) with ulcer of bilateral lower extremity: Secondary | ICD-10-CM | POA: Diagnosis not present

## 2020-09-10 DIAGNOSIS — E1122 Type 2 diabetes mellitus with diabetic chronic kidney disease: Secondary | ICD-10-CM | POA: Diagnosis not present

## 2020-09-10 DIAGNOSIS — I482 Chronic atrial fibrillation, unspecified: Secondary | ICD-10-CM | POA: Diagnosis not present

## 2020-09-10 DIAGNOSIS — E1142 Type 2 diabetes mellitus with diabetic polyneuropathy: Secondary | ICD-10-CM | POA: Diagnosis not present

## 2020-09-10 DIAGNOSIS — L97811 Non-pressure chronic ulcer of other part of right lower leg limited to breakdown of skin: Secondary | ICD-10-CM | POA: Diagnosis not present

## 2020-09-10 DIAGNOSIS — I129 Hypertensive chronic kidney disease with stage 1 through stage 4 chronic kidney disease, or unspecified chronic kidney disease: Secondary | ICD-10-CM | POA: Diagnosis not present

## 2020-09-10 DIAGNOSIS — N183 Chronic kidney disease, stage 3 unspecified: Secondary | ICD-10-CM | POA: Diagnosis not present

## 2020-09-10 DIAGNOSIS — E1151 Type 2 diabetes mellitus with diabetic peripheral angiopathy without gangrene: Secondary | ICD-10-CM | POA: Diagnosis not present

## 2020-09-10 DIAGNOSIS — L97821 Non-pressure chronic ulcer of other part of left lower leg limited to breakdown of skin: Secondary | ICD-10-CM | POA: Diagnosis not present

## 2020-09-10 DIAGNOSIS — I87313 Chronic venous hypertension (idiopathic) with ulcer of bilateral lower extremity: Secondary | ICD-10-CM | POA: Diagnosis not present

## 2020-09-10 DIAGNOSIS — Z7984 Long term (current) use of oral hypoglycemic drugs: Secondary | ICD-10-CM | POA: Diagnosis not present

## 2020-09-10 DIAGNOSIS — Z4800 Encounter for change or removal of nonsurgical wound dressing: Secondary | ICD-10-CM | POA: Diagnosis not present

## 2020-09-12 DIAGNOSIS — Z4800 Encounter for change or removal of nonsurgical wound dressing: Secondary | ICD-10-CM | POA: Diagnosis not present

## 2020-09-12 DIAGNOSIS — N183 Chronic kidney disease, stage 3 unspecified: Secondary | ICD-10-CM | POA: Diagnosis not present

## 2020-09-12 DIAGNOSIS — E1142 Type 2 diabetes mellitus with diabetic polyneuropathy: Secondary | ICD-10-CM | POA: Diagnosis not present

## 2020-09-12 DIAGNOSIS — I129 Hypertensive chronic kidney disease with stage 1 through stage 4 chronic kidney disease, or unspecified chronic kidney disease: Secondary | ICD-10-CM | POA: Diagnosis not present

## 2020-09-12 DIAGNOSIS — E1151 Type 2 diabetes mellitus with diabetic peripheral angiopathy without gangrene: Secondary | ICD-10-CM | POA: Diagnosis not present

## 2020-09-12 DIAGNOSIS — I482 Chronic atrial fibrillation, unspecified: Secondary | ICD-10-CM | POA: Diagnosis not present

## 2020-09-12 DIAGNOSIS — Z7984 Long term (current) use of oral hypoglycemic drugs: Secondary | ICD-10-CM | POA: Diagnosis not present

## 2020-09-12 DIAGNOSIS — E1122 Type 2 diabetes mellitus with diabetic chronic kidney disease: Secondary | ICD-10-CM | POA: Diagnosis not present

## 2020-09-12 DIAGNOSIS — L97811 Non-pressure chronic ulcer of other part of right lower leg limited to breakdown of skin: Secondary | ICD-10-CM | POA: Diagnosis not present

## 2020-09-12 DIAGNOSIS — L97821 Non-pressure chronic ulcer of other part of left lower leg limited to breakdown of skin: Secondary | ICD-10-CM | POA: Diagnosis not present

## 2020-09-12 DIAGNOSIS — I87313 Chronic venous hypertension (idiopathic) with ulcer of bilateral lower extremity: Secondary | ICD-10-CM | POA: Diagnosis not present

## 2020-09-15 DIAGNOSIS — Z4800 Encounter for change or removal of nonsurgical wound dressing: Secondary | ICD-10-CM | POA: Diagnosis not present

## 2020-09-15 DIAGNOSIS — I129 Hypertensive chronic kidney disease with stage 1 through stage 4 chronic kidney disease, or unspecified chronic kidney disease: Secondary | ICD-10-CM | POA: Diagnosis not present

## 2020-09-15 DIAGNOSIS — I87313 Chronic venous hypertension (idiopathic) with ulcer of bilateral lower extremity: Secondary | ICD-10-CM | POA: Diagnosis not present

## 2020-09-15 DIAGNOSIS — L97821 Non-pressure chronic ulcer of other part of left lower leg limited to breakdown of skin: Secondary | ICD-10-CM | POA: Diagnosis not present

## 2020-09-15 DIAGNOSIS — N183 Chronic kidney disease, stage 3 unspecified: Secondary | ICD-10-CM | POA: Diagnosis not present

## 2020-09-15 DIAGNOSIS — E1151 Type 2 diabetes mellitus with diabetic peripheral angiopathy without gangrene: Secondary | ICD-10-CM | POA: Diagnosis not present

## 2020-09-15 DIAGNOSIS — L97811 Non-pressure chronic ulcer of other part of right lower leg limited to breakdown of skin: Secondary | ICD-10-CM | POA: Diagnosis not present

## 2020-09-15 DIAGNOSIS — Z7984 Long term (current) use of oral hypoglycemic drugs: Secondary | ICD-10-CM | POA: Diagnosis not present

## 2020-09-15 DIAGNOSIS — E1122 Type 2 diabetes mellitus with diabetic chronic kidney disease: Secondary | ICD-10-CM | POA: Diagnosis not present

## 2020-09-15 DIAGNOSIS — E1142 Type 2 diabetes mellitus with diabetic polyneuropathy: Secondary | ICD-10-CM | POA: Diagnosis not present

## 2020-09-15 DIAGNOSIS — I482 Chronic atrial fibrillation, unspecified: Secondary | ICD-10-CM | POA: Diagnosis not present

## 2020-09-17 DIAGNOSIS — E1122 Type 2 diabetes mellitus with diabetic chronic kidney disease: Secondary | ICD-10-CM | POA: Diagnosis not present

## 2020-09-17 DIAGNOSIS — Z7984 Long term (current) use of oral hypoglycemic drugs: Secondary | ICD-10-CM | POA: Diagnosis not present

## 2020-09-17 DIAGNOSIS — L97821 Non-pressure chronic ulcer of other part of left lower leg limited to breakdown of skin: Secondary | ICD-10-CM | POA: Diagnosis not present

## 2020-09-17 DIAGNOSIS — E1151 Type 2 diabetes mellitus with diabetic peripheral angiopathy without gangrene: Secondary | ICD-10-CM | POA: Diagnosis not present

## 2020-09-17 DIAGNOSIS — I87313 Chronic venous hypertension (idiopathic) with ulcer of bilateral lower extremity: Secondary | ICD-10-CM | POA: Diagnosis not present

## 2020-09-17 DIAGNOSIS — Z4800 Encounter for change or removal of nonsurgical wound dressing: Secondary | ICD-10-CM | POA: Diagnosis not present

## 2020-09-17 DIAGNOSIS — I129 Hypertensive chronic kidney disease with stage 1 through stage 4 chronic kidney disease, or unspecified chronic kidney disease: Secondary | ICD-10-CM | POA: Diagnosis not present

## 2020-09-17 DIAGNOSIS — N183 Chronic kidney disease, stage 3 unspecified: Secondary | ICD-10-CM | POA: Diagnosis not present

## 2020-09-17 DIAGNOSIS — I482 Chronic atrial fibrillation, unspecified: Secondary | ICD-10-CM | POA: Diagnosis not present

## 2020-09-17 DIAGNOSIS — E1142 Type 2 diabetes mellitus with diabetic polyneuropathy: Secondary | ICD-10-CM | POA: Diagnosis not present

## 2020-09-17 DIAGNOSIS — L97811 Non-pressure chronic ulcer of other part of right lower leg limited to breakdown of skin: Secondary | ICD-10-CM | POA: Diagnosis not present

## 2020-09-19 DIAGNOSIS — L97821 Non-pressure chronic ulcer of other part of left lower leg limited to breakdown of skin: Secondary | ICD-10-CM | POA: Diagnosis not present

## 2020-09-19 DIAGNOSIS — N183 Chronic kidney disease, stage 3 unspecified: Secondary | ICD-10-CM | POA: Diagnosis not present

## 2020-09-19 DIAGNOSIS — Z4800 Encounter for change or removal of nonsurgical wound dressing: Secondary | ICD-10-CM | POA: Diagnosis not present

## 2020-09-19 DIAGNOSIS — E1122 Type 2 diabetes mellitus with diabetic chronic kidney disease: Secondary | ICD-10-CM | POA: Diagnosis not present

## 2020-09-19 DIAGNOSIS — E1142 Type 2 diabetes mellitus with diabetic polyneuropathy: Secondary | ICD-10-CM | POA: Diagnosis not present

## 2020-09-19 DIAGNOSIS — I129 Hypertensive chronic kidney disease with stage 1 through stage 4 chronic kidney disease, or unspecified chronic kidney disease: Secondary | ICD-10-CM | POA: Diagnosis not present

## 2020-09-19 DIAGNOSIS — I87313 Chronic venous hypertension (idiopathic) with ulcer of bilateral lower extremity: Secondary | ICD-10-CM | POA: Diagnosis not present

## 2020-09-19 DIAGNOSIS — Z7984 Long term (current) use of oral hypoglycemic drugs: Secondary | ICD-10-CM | POA: Diagnosis not present

## 2020-09-19 DIAGNOSIS — I482 Chronic atrial fibrillation, unspecified: Secondary | ICD-10-CM | POA: Diagnosis not present

## 2020-09-19 DIAGNOSIS — L97811 Non-pressure chronic ulcer of other part of right lower leg limited to breakdown of skin: Secondary | ICD-10-CM | POA: Diagnosis not present

## 2020-09-19 DIAGNOSIS — E1151 Type 2 diabetes mellitus with diabetic peripheral angiopathy without gangrene: Secondary | ICD-10-CM | POA: Diagnosis not present

## 2020-09-24 DIAGNOSIS — N183 Chronic kidney disease, stage 3 unspecified: Secondary | ICD-10-CM | POA: Diagnosis not present

## 2020-09-24 DIAGNOSIS — L97811 Non-pressure chronic ulcer of other part of right lower leg limited to breakdown of skin: Secondary | ICD-10-CM | POA: Diagnosis not present

## 2020-09-24 DIAGNOSIS — E1122 Type 2 diabetes mellitus with diabetic chronic kidney disease: Secondary | ICD-10-CM | POA: Diagnosis not present

## 2020-09-24 DIAGNOSIS — I129 Hypertensive chronic kidney disease with stage 1 through stage 4 chronic kidney disease, or unspecified chronic kidney disease: Secondary | ICD-10-CM | POA: Diagnosis not present

## 2020-09-24 DIAGNOSIS — L97821 Non-pressure chronic ulcer of other part of left lower leg limited to breakdown of skin: Secondary | ICD-10-CM | POA: Diagnosis not present

## 2020-09-24 DIAGNOSIS — I482 Chronic atrial fibrillation, unspecified: Secondary | ICD-10-CM | POA: Diagnosis not present

## 2020-09-24 DIAGNOSIS — E1142 Type 2 diabetes mellitus with diabetic polyneuropathy: Secondary | ICD-10-CM | POA: Diagnosis not present

## 2020-09-24 DIAGNOSIS — I87313 Chronic venous hypertension (idiopathic) with ulcer of bilateral lower extremity: Secondary | ICD-10-CM | POA: Diagnosis not present

## 2020-09-24 DIAGNOSIS — E1151 Type 2 diabetes mellitus with diabetic peripheral angiopathy without gangrene: Secondary | ICD-10-CM | POA: Diagnosis not present

## 2020-09-24 DIAGNOSIS — Z4800 Encounter for change or removal of nonsurgical wound dressing: Secondary | ICD-10-CM | POA: Diagnosis not present

## 2020-09-24 DIAGNOSIS — Z7984 Long term (current) use of oral hypoglycemic drugs: Secondary | ICD-10-CM | POA: Diagnosis not present

## 2020-09-26 DIAGNOSIS — E1151 Type 2 diabetes mellitus with diabetic peripheral angiopathy without gangrene: Secondary | ICD-10-CM | POA: Diagnosis not present

## 2020-09-26 DIAGNOSIS — I87313 Chronic venous hypertension (idiopathic) with ulcer of bilateral lower extremity: Secondary | ICD-10-CM | POA: Diagnosis not present

## 2020-09-26 DIAGNOSIS — I482 Chronic atrial fibrillation, unspecified: Secondary | ICD-10-CM | POA: Diagnosis not present

## 2020-09-26 DIAGNOSIS — L97811 Non-pressure chronic ulcer of other part of right lower leg limited to breakdown of skin: Secondary | ICD-10-CM | POA: Diagnosis not present

## 2020-09-26 DIAGNOSIS — I129 Hypertensive chronic kidney disease with stage 1 through stage 4 chronic kidney disease, or unspecified chronic kidney disease: Secondary | ICD-10-CM | POA: Diagnosis not present

## 2020-09-26 DIAGNOSIS — Z7984 Long term (current) use of oral hypoglycemic drugs: Secondary | ICD-10-CM | POA: Diagnosis not present

## 2020-09-26 DIAGNOSIS — E1142 Type 2 diabetes mellitus with diabetic polyneuropathy: Secondary | ICD-10-CM | POA: Diagnosis not present

## 2020-09-26 DIAGNOSIS — N183 Chronic kidney disease, stage 3 unspecified: Secondary | ICD-10-CM | POA: Diagnosis not present

## 2020-09-26 DIAGNOSIS — E1122 Type 2 diabetes mellitus with diabetic chronic kidney disease: Secondary | ICD-10-CM | POA: Diagnosis not present

## 2020-09-26 DIAGNOSIS — L97821 Non-pressure chronic ulcer of other part of left lower leg limited to breakdown of skin: Secondary | ICD-10-CM | POA: Diagnosis not present

## 2020-09-26 DIAGNOSIS — Z4800 Encounter for change or removal of nonsurgical wound dressing: Secondary | ICD-10-CM | POA: Diagnosis not present

## 2020-09-29 DIAGNOSIS — L97821 Non-pressure chronic ulcer of other part of left lower leg limited to breakdown of skin: Secondary | ICD-10-CM | POA: Diagnosis not present

## 2020-09-29 DIAGNOSIS — L97811 Non-pressure chronic ulcer of other part of right lower leg limited to breakdown of skin: Secondary | ICD-10-CM | POA: Diagnosis not present

## 2020-09-29 DIAGNOSIS — E1151 Type 2 diabetes mellitus with diabetic peripheral angiopathy without gangrene: Secondary | ICD-10-CM | POA: Diagnosis not present

## 2020-09-29 DIAGNOSIS — I129 Hypertensive chronic kidney disease with stage 1 through stage 4 chronic kidney disease, or unspecified chronic kidney disease: Secondary | ICD-10-CM | POA: Diagnosis not present

## 2020-09-29 DIAGNOSIS — Z7984 Long term (current) use of oral hypoglycemic drugs: Secondary | ICD-10-CM | POA: Diagnosis not present

## 2020-09-29 DIAGNOSIS — I482 Chronic atrial fibrillation, unspecified: Secondary | ICD-10-CM | POA: Diagnosis not present

## 2020-09-29 DIAGNOSIS — E1142 Type 2 diabetes mellitus with diabetic polyneuropathy: Secondary | ICD-10-CM | POA: Diagnosis not present

## 2020-09-29 DIAGNOSIS — N183 Chronic kidney disease, stage 3 unspecified: Secondary | ICD-10-CM | POA: Diagnosis not present

## 2020-09-29 DIAGNOSIS — Z4800 Encounter for change or removal of nonsurgical wound dressing: Secondary | ICD-10-CM | POA: Diagnosis not present

## 2020-09-29 DIAGNOSIS — I87313 Chronic venous hypertension (idiopathic) with ulcer of bilateral lower extremity: Secondary | ICD-10-CM | POA: Diagnosis not present

## 2020-09-29 DIAGNOSIS — E1122 Type 2 diabetes mellitus with diabetic chronic kidney disease: Secondary | ICD-10-CM | POA: Diagnosis not present

## 2020-09-30 DIAGNOSIS — E1140 Type 2 diabetes mellitus with diabetic neuropathy, unspecified: Secondary | ICD-10-CM | POA: Diagnosis not present

## 2020-09-30 DIAGNOSIS — E1151 Type 2 diabetes mellitus with diabetic peripheral angiopathy without gangrene: Secondary | ICD-10-CM | POA: Diagnosis not present

## 2020-10-01 DIAGNOSIS — Z7984 Long term (current) use of oral hypoglycemic drugs: Secondary | ICD-10-CM | POA: Diagnosis not present

## 2020-10-01 DIAGNOSIS — L97821 Non-pressure chronic ulcer of other part of left lower leg limited to breakdown of skin: Secondary | ICD-10-CM | POA: Diagnosis not present

## 2020-10-01 DIAGNOSIS — I129 Hypertensive chronic kidney disease with stage 1 through stage 4 chronic kidney disease, or unspecified chronic kidney disease: Secondary | ICD-10-CM | POA: Diagnosis not present

## 2020-10-01 DIAGNOSIS — L97811 Non-pressure chronic ulcer of other part of right lower leg limited to breakdown of skin: Secondary | ICD-10-CM | POA: Diagnosis not present

## 2020-10-01 DIAGNOSIS — E1142 Type 2 diabetes mellitus with diabetic polyneuropathy: Secondary | ICD-10-CM | POA: Diagnosis not present

## 2020-10-01 DIAGNOSIS — Z4800 Encounter for change or removal of nonsurgical wound dressing: Secondary | ICD-10-CM | POA: Diagnosis not present

## 2020-10-01 DIAGNOSIS — N183 Chronic kidney disease, stage 3 unspecified: Secondary | ICD-10-CM | POA: Diagnosis not present

## 2020-10-01 DIAGNOSIS — E1151 Type 2 diabetes mellitus with diabetic peripheral angiopathy without gangrene: Secondary | ICD-10-CM | POA: Diagnosis not present

## 2020-10-01 DIAGNOSIS — I482 Chronic atrial fibrillation, unspecified: Secondary | ICD-10-CM | POA: Diagnosis not present

## 2020-10-01 DIAGNOSIS — E1122 Type 2 diabetes mellitus with diabetic chronic kidney disease: Secondary | ICD-10-CM | POA: Diagnosis not present

## 2020-10-01 DIAGNOSIS — I87313 Chronic venous hypertension (idiopathic) with ulcer of bilateral lower extremity: Secondary | ICD-10-CM | POA: Diagnosis not present

## 2020-10-03 DIAGNOSIS — Z7984 Long term (current) use of oral hypoglycemic drugs: Secondary | ICD-10-CM | POA: Diagnosis not present

## 2020-10-03 DIAGNOSIS — L97821 Non-pressure chronic ulcer of other part of left lower leg limited to breakdown of skin: Secondary | ICD-10-CM | POA: Diagnosis not present

## 2020-10-03 DIAGNOSIS — E1142 Type 2 diabetes mellitus with diabetic polyneuropathy: Secondary | ICD-10-CM | POA: Diagnosis not present

## 2020-10-03 DIAGNOSIS — L97811 Non-pressure chronic ulcer of other part of right lower leg limited to breakdown of skin: Secondary | ICD-10-CM | POA: Diagnosis not present

## 2020-10-03 DIAGNOSIS — N183 Chronic kidney disease, stage 3 unspecified: Secondary | ICD-10-CM | POA: Diagnosis not present

## 2020-10-03 DIAGNOSIS — Z4800 Encounter for change or removal of nonsurgical wound dressing: Secondary | ICD-10-CM | POA: Diagnosis not present

## 2020-10-03 DIAGNOSIS — I482 Chronic atrial fibrillation, unspecified: Secondary | ICD-10-CM | POA: Diagnosis not present

## 2020-10-03 DIAGNOSIS — I87313 Chronic venous hypertension (idiopathic) with ulcer of bilateral lower extremity: Secondary | ICD-10-CM | POA: Diagnosis not present

## 2020-10-03 DIAGNOSIS — E1151 Type 2 diabetes mellitus with diabetic peripheral angiopathy without gangrene: Secondary | ICD-10-CM | POA: Diagnosis not present

## 2020-10-03 DIAGNOSIS — I129 Hypertensive chronic kidney disease with stage 1 through stage 4 chronic kidney disease, or unspecified chronic kidney disease: Secondary | ICD-10-CM | POA: Diagnosis not present

## 2020-10-03 DIAGNOSIS — E1122 Type 2 diabetes mellitus with diabetic chronic kidney disease: Secondary | ICD-10-CM | POA: Diagnosis not present

## 2020-10-06 DIAGNOSIS — I87313 Chronic venous hypertension (idiopathic) with ulcer of bilateral lower extremity: Secondary | ICD-10-CM | POA: Diagnosis not present

## 2020-10-06 DIAGNOSIS — Z7984 Long term (current) use of oral hypoglycemic drugs: Secondary | ICD-10-CM | POA: Diagnosis not present

## 2020-10-06 DIAGNOSIS — I482 Chronic atrial fibrillation, unspecified: Secondary | ICD-10-CM | POA: Diagnosis not present

## 2020-10-06 DIAGNOSIS — E1151 Type 2 diabetes mellitus with diabetic peripheral angiopathy without gangrene: Secondary | ICD-10-CM | POA: Diagnosis not present

## 2020-10-06 DIAGNOSIS — E1122 Type 2 diabetes mellitus with diabetic chronic kidney disease: Secondary | ICD-10-CM | POA: Diagnosis not present

## 2020-10-06 DIAGNOSIS — E1142 Type 2 diabetes mellitus with diabetic polyneuropathy: Secondary | ICD-10-CM | POA: Diagnosis not present

## 2020-10-06 DIAGNOSIS — Z4800 Encounter for change or removal of nonsurgical wound dressing: Secondary | ICD-10-CM | POA: Diagnosis not present

## 2020-10-06 DIAGNOSIS — L97811 Non-pressure chronic ulcer of other part of right lower leg limited to breakdown of skin: Secondary | ICD-10-CM | POA: Diagnosis not present

## 2020-10-06 DIAGNOSIS — L97821 Non-pressure chronic ulcer of other part of left lower leg limited to breakdown of skin: Secondary | ICD-10-CM | POA: Diagnosis not present

## 2020-10-06 DIAGNOSIS — I129 Hypertensive chronic kidney disease with stage 1 through stage 4 chronic kidney disease, or unspecified chronic kidney disease: Secondary | ICD-10-CM | POA: Diagnosis not present

## 2020-10-06 DIAGNOSIS — N183 Chronic kidney disease, stage 3 unspecified: Secondary | ICD-10-CM | POA: Diagnosis not present

## 2020-10-10 DIAGNOSIS — E1142 Type 2 diabetes mellitus with diabetic polyneuropathy: Secondary | ICD-10-CM | POA: Diagnosis not present

## 2020-10-10 DIAGNOSIS — I482 Chronic atrial fibrillation, unspecified: Secondary | ICD-10-CM | POA: Diagnosis not present

## 2020-10-10 DIAGNOSIS — L97811 Non-pressure chronic ulcer of other part of right lower leg limited to breakdown of skin: Secondary | ICD-10-CM | POA: Diagnosis not present

## 2020-10-10 DIAGNOSIS — E1151 Type 2 diabetes mellitus with diabetic peripheral angiopathy without gangrene: Secondary | ICD-10-CM | POA: Diagnosis not present

## 2020-10-10 DIAGNOSIS — L97821 Non-pressure chronic ulcer of other part of left lower leg limited to breakdown of skin: Secondary | ICD-10-CM | POA: Diagnosis not present

## 2020-10-10 DIAGNOSIS — I129 Hypertensive chronic kidney disease with stage 1 through stage 4 chronic kidney disease, or unspecified chronic kidney disease: Secondary | ICD-10-CM | POA: Diagnosis not present

## 2020-10-10 DIAGNOSIS — E1122 Type 2 diabetes mellitus with diabetic chronic kidney disease: Secondary | ICD-10-CM | POA: Diagnosis not present

## 2020-10-10 DIAGNOSIS — Z7984 Long term (current) use of oral hypoglycemic drugs: Secondary | ICD-10-CM | POA: Diagnosis not present

## 2020-10-10 DIAGNOSIS — I87313 Chronic venous hypertension (idiopathic) with ulcer of bilateral lower extremity: Secondary | ICD-10-CM | POA: Diagnosis not present

## 2020-10-10 DIAGNOSIS — Z4800 Encounter for change or removal of nonsurgical wound dressing: Secondary | ICD-10-CM | POA: Diagnosis not present

## 2020-10-10 DIAGNOSIS — N183 Chronic kidney disease, stage 3 unspecified: Secondary | ICD-10-CM | POA: Diagnosis not present

## 2020-10-11 DIAGNOSIS — Z23 Encounter for immunization: Secondary | ICD-10-CM | POA: Diagnosis not present

## 2020-10-14 DIAGNOSIS — I87313 Chronic venous hypertension (idiopathic) with ulcer of bilateral lower extremity: Secondary | ICD-10-CM | POA: Diagnosis not present

## 2020-10-14 DIAGNOSIS — N183 Chronic kidney disease, stage 3 unspecified: Secondary | ICD-10-CM | POA: Diagnosis not present

## 2020-10-14 DIAGNOSIS — Z4800 Encounter for change or removal of nonsurgical wound dressing: Secondary | ICD-10-CM | POA: Diagnosis not present

## 2020-10-14 DIAGNOSIS — I482 Chronic atrial fibrillation, unspecified: Secondary | ICD-10-CM | POA: Diagnosis not present

## 2020-10-14 DIAGNOSIS — E1122 Type 2 diabetes mellitus with diabetic chronic kidney disease: Secondary | ICD-10-CM | POA: Diagnosis not present

## 2020-10-14 DIAGNOSIS — E1151 Type 2 diabetes mellitus with diabetic peripheral angiopathy without gangrene: Secondary | ICD-10-CM | POA: Diagnosis not present

## 2020-10-14 DIAGNOSIS — Z7984 Long term (current) use of oral hypoglycemic drugs: Secondary | ICD-10-CM | POA: Diagnosis not present

## 2020-10-14 DIAGNOSIS — L97821 Non-pressure chronic ulcer of other part of left lower leg limited to breakdown of skin: Secondary | ICD-10-CM | POA: Diagnosis not present

## 2020-10-14 DIAGNOSIS — E1142 Type 2 diabetes mellitus with diabetic polyneuropathy: Secondary | ICD-10-CM | POA: Diagnosis not present

## 2020-10-14 DIAGNOSIS — I129 Hypertensive chronic kidney disease with stage 1 through stage 4 chronic kidney disease, or unspecified chronic kidney disease: Secondary | ICD-10-CM | POA: Diagnosis not present

## 2020-10-14 DIAGNOSIS — L97811 Non-pressure chronic ulcer of other part of right lower leg limited to breakdown of skin: Secondary | ICD-10-CM | POA: Diagnosis not present

## 2020-10-17 DIAGNOSIS — N183 Chronic kidney disease, stage 3 unspecified: Secondary | ICD-10-CM | POA: Diagnosis not present

## 2020-10-17 DIAGNOSIS — L97821 Non-pressure chronic ulcer of other part of left lower leg limited to breakdown of skin: Secondary | ICD-10-CM | POA: Diagnosis not present

## 2020-10-17 DIAGNOSIS — E1142 Type 2 diabetes mellitus with diabetic polyneuropathy: Secondary | ICD-10-CM | POA: Diagnosis not present

## 2020-10-17 DIAGNOSIS — E1122 Type 2 diabetes mellitus with diabetic chronic kidney disease: Secondary | ICD-10-CM | POA: Diagnosis not present

## 2020-10-17 DIAGNOSIS — L97811 Non-pressure chronic ulcer of other part of right lower leg limited to breakdown of skin: Secondary | ICD-10-CM | POA: Diagnosis not present

## 2020-10-17 DIAGNOSIS — I87313 Chronic venous hypertension (idiopathic) with ulcer of bilateral lower extremity: Secondary | ICD-10-CM | POA: Diagnosis not present

## 2020-10-17 DIAGNOSIS — I482 Chronic atrial fibrillation, unspecified: Secondary | ICD-10-CM | POA: Diagnosis not present

## 2020-10-17 DIAGNOSIS — E1151 Type 2 diabetes mellitus with diabetic peripheral angiopathy without gangrene: Secondary | ICD-10-CM | POA: Diagnosis not present

## 2020-10-17 DIAGNOSIS — Z4800 Encounter for change or removal of nonsurgical wound dressing: Secondary | ICD-10-CM | POA: Diagnosis not present

## 2020-10-17 DIAGNOSIS — Z7984 Long term (current) use of oral hypoglycemic drugs: Secondary | ICD-10-CM | POA: Diagnosis not present

## 2020-10-17 DIAGNOSIS — I129 Hypertensive chronic kidney disease with stage 1 through stage 4 chronic kidney disease, or unspecified chronic kidney disease: Secondary | ICD-10-CM | POA: Diagnosis not present

## 2020-10-21 DIAGNOSIS — E1142 Type 2 diabetes mellitus with diabetic polyneuropathy: Secondary | ICD-10-CM | POA: Diagnosis not present

## 2020-10-21 DIAGNOSIS — L97821 Non-pressure chronic ulcer of other part of left lower leg limited to breakdown of skin: Secondary | ICD-10-CM | POA: Diagnosis not present

## 2020-10-21 DIAGNOSIS — I482 Chronic atrial fibrillation, unspecified: Secondary | ICD-10-CM | POA: Diagnosis not present

## 2020-10-21 DIAGNOSIS — E1151 Type 2 diabetes mellitus with diabetic peripheral angiopathy without gangrene: Secondary | ICD-10-CM | POA: Diagnosis not present

## 2020-10-21 DIAGNOSIS — Z7984 Long term (current) use of oral hypoglycemic drugs: Secondary | ICD-10-CM | POA: Diagnosis not present

## 2020-10-21 DIAGNOSIS — E1122 Type 2 diabetes mellitus with diabetic chronic kidney disease: Secondary | ICD-10-CM | POA: Diagnosis not present

## 2020-10-21 DIAGNOSIS — N183 Chronic kidney disease, stage 3 unspecified: Secondary | ICD-10-CM | POA: Diagnosis not present

## 2020-10-21 DIAGNOSIS — I87313 Chronic venous hypertension (idiopathic) with ulcer of bilateral lower extremity: Secondary | ICD-10-CM | POA: Diagnosis not present

## 2020-10-21 DIAGNOSIS — I129 Hypertensive chronic kidney disease with stage 1 through stage 4 chronic kidney disease, or unspecified chronic kidney disease: Secondary | ICD-10-CM | POA: Diagnosis not present

## 2020-10-24 DIAGNOSIS — I482 Chronic atrial fibrillation, unspecified: Secondary | ICD-10-CM | POA: Diagnosis not present

## 2020-10-24 DIAGNOSIS — E1142 Type 2 diabetes mellitus with diabetic polyneuropathy: Secondary | ICD-10-CM | POA: Diagnosis not present

## 2020-10-24 DIAGNOSIS — E1122 Type 2 diabetes mellitus with diabetic chronic kidney disease: Secondary | ICD-10-CM | POA: Diagnosis not present

## 2020-10-24 DIAGNOSIS — L97821 Non-pressure chronic ulcer of other part of left lower leg limited to breakdown of skin: Secondary | ICD-10-CM | POA: Diagnosis not present

## 2020-10-24 DIAGNOSIS — I87313 Chronic venous hypertension (idiopathic) with ulcer of bilateral lower extremity: Secondary | ICD-10-CM | POA: Diagnosis not present

## 2020-10-24 DIAGNOSIS — Z7984 Long term (current) use of oral hypoglycemic drugs: Secondary | ICD-10-CM | POA: Diagnosis not present

## 2020-10-24 DIAGNOSIS — N183 Chronic kidney disease, stage 3 unspecified: Secondary | ICD-10-CM | POA: Diagnosis not present

## 2020-10-24 DIAGNOSIS — I129 Hypertensive chronic kidney disease with stage 1 through stage 4 chronic kidney disease, or unspecified chronic kidney disease: Secondary | ICD-10-CM | POA: Diagnosis not present

## 2020-10-24 DIAGNOSIS — E1151 Type 2 diabetes mellitus with diabetic peripheral angiopathy without gangrene: Secondary | ICD-10-CM | POA: Diagnosis not present

## 2020-10-28 DIAGNOSIS — E1151 Type 2 diabetes mellitus with diabetic peripheral angiopathy without gangrene: Secondary | ICD-10-CM | POA: Diagnosis not present

## 2020-10-28 DIAGNOSIS — Z7984 Long term (current) use of oral hypoglycemic drugs: Secondary | ICD-10-CM | POA: Diagnosis not present

## 2020-10-28 DIAGNOSIS — I87313 Chronic venous hypertension (idiopathic) with ulcer of bilateral lower extremity: Secondary | ICD-10-CM | POA: Diagnosis not present

## 2020-10-28 DIAGNOSIS — E1122 Type 2 diabetes mellitus with diabetic chronic kidney disease: Secondary | ICD-10-CM | POA: Diagnosis not present

## 2020-10-28 DIAGNOSIS — I129 Hypertensive chronic kidney disease with stage 1 through stage 4 chronic kidney disease, or unspecified chronic kidney disease: Secondary | ICD-10-CM | POA: Diagnosis not present

## 2020-10-28 DIAGNOSIS — N183 Chronic kidney disease, stage 3 unspecified: Secondary | ICD-10-CM | POA: Diagnosis not present

## 2020-10-28 DIAGNOSIS — E1142 Type 2 diabetes mellitus with diabetic polyneuropathy: Secondary | ICD-10-CM | POA: Diagnosis not present

## 2020-10-28 DIAGNOSIS — L97821 Non-pressure chronic ulcer of other part of left lower leg limited to breakdown of skin: Secondary | ICD-10-CM | POA: Diagnosis not present

## 2020-10-28 DIAGNOSIS — I482 Chronic atrial fibrillation, unspecified: Secondary | ICD-10-CM | POA: Diagnosis not present

## 2020-10-31 DIAGNOSIS — L97821 Non-pressure chronic ulcer of other part of left lower leg limited to breakdown of skin: Secondary | ICD-10-CM | POA: Diagnosis not present

## 2020-10-31 DIAGNOSIS — I482 Chronic atrial fibrillation, unspecified: Secondary | ICD-10-CM | POA: Diagnosis not present

## 2020-10-31 DIAGNOSIS — E1122 Type 2 diabetes mellitus with diabetic chronic kidney disease: Secondary | ICD-10-CM | POA: Diagnosis not present

## 2020-10-31 DIAGNOSIS — E1142 Type 2 diabetes mellitus with diabetic polyneuropathy: Secondary | ICD-10-CM | POA: Diagnosis not present

## 2020-10-31 DIAGNOSIS — I87313 Chronic venous hypertension (idiopathic) with ulcer of bilateral lower extremity: Secondary | ICD-10-CM | POA: Diagnosis not present

## 2020-10-31 DIAGNOSIS — Z7984 Long term (current) use of oral hypoglycemic drugs: Secondary | ICD-10-CM | POA: Diagnosis not present

## 2020-10-31 DIAGNOSIS — N183 Chronic kidney disease, stage 3 unspecified: Secondary | ICD-10-CM | POA: Diagnosis not present

## 2020-10-31 DIAGNOSIS — E1151 Type 2 diabetes mellitus with diabetic peripheral angiopathy without gangrene: Secondary | ICD-10-CM | POA: Diagnosis not present

## 2020-10-31 DIAGNOSIS — I129 Hypertensive chronic kidney disease with stage 1 through stage 4 chronic kidney disease, or unspecified chronic kidney disease: Secondary | ICD-10-CM | POA: Diagnosis not present

## 2020-11-03 DIAGNOSIS — L03211 Cellulitis of face: Secondary | ICD-10-CM | POA: Diagnosis not present

## 2020-11-04 DIAGNOSIS — E1151 Type 2 diabetes mellitus with diabetic peripheral angiopathy without gangrene: Secondary | ICD-10-CM | POA: Diagnosis not present

## 2020-11-04 DIAGNOSIS — I129 Hypertensive chronic kidney disease with stage 1 through stage 4 chronic kidney disease, or unspecified chronic kidney disease: Secondary | ICD-10-CM | POA: Diagnosis not present

## 2020-11-04 DIAGNOSIS — L02811 Cutaneous abscess of head [any part, except face]: Secondary | ICD-10-CM | POA: Diagnosis not present

## 2020-11-04 DIAGNOSIS — L97821 Non-pressure chronic ulcer of other part of left lower leg limited to breakdown of skin: Secondary | ICD-10-CM | POA: Diagnosis not present

## 2020-11-04 DIAGNOSIS — E1122 Type 2 diabetes mellitus with diabetic chronic kidney disease: Secondary | ICD-10-CM | POA: Diagnosis not present

## 2020-11-04 DIAGNOSIS — L03811 Cellulitis of head [any part, except face]: Secondary | ICD-10-CM | POA: Diagnosis not present

## 2020-11-04 DIAGNOSIS — Z7984 Long term (current) use of oral hypoglycemic drugs: Secondary | ICD-10-CM | POA: Diagnosis not present

## 2020-11-04 DIAGNOSIS — I482 Chronic atrial fibrillation, unspecified: Secondary | ICD-10-CM | POA: Diagnosis not present

## 2020-11-04 DIAGNOSIS — I87313 Chronic venous hypertension (idiopathic) with ulcer of bilateral lower extremity: Secondary | ICD-10-CM | POA: Diagnosis not present

## 2020-11-04 DIAGNOSIS — N183 Chronic kidney disease, stage 3 unspecified: Secondary | ICD-10-CM | POA: Diagnosis not present

## 2020-11-04 DIAGNOSIS — E1142 Type 2 diabetes mellitus with diabetic polyneuropathy: Secondary | ICD-10-CM | POA: Diagnosis not present

## 2020-11-05 DIAGNOSIS — L03211 Cellulitis of face: Secondary | ICD-10-CM | POA: Diagnosis not present

## 2020-11-07 DIAGNOSIS — E1142 Type 2 diabetes mellitus with diabetic polyneuropathy: Secondary | ICD-10-CM | POA: Diagnosis not present

## 2020-11-07 DIAGNOSIS — Z7984 Long term (current) use of oral hypoglycemic drugs: Secondary | ICD-10-CM | POA: Diagnosis not present

## 2020-11-07 DIAGNOSIS — L97821 Non-pressure chronic ulcer of other part of left lower leg limited to breakdown of skin: Secondary | ICD-10-CM | POA: Diagnosis not present

## 2020-11-07 DIAGNOSIS — E1151 Type 2 diabetes mellitus with diabetic peripheral angiopathy without gangrene: Secondary | ICD-10-CM | POA: Diagnosis not present

## 2020-11-07 DIAGNOSIS — I482 Chronic atrial fibrillation, unspecified: Secondary | ICD-10-CM | POA: Diagnosis not present

## 2020-11-07 DIAGNOSIS — I129 Hypertensive chronic kidney disease with stage 1 through stage 4 chronic kidney disease, or unspecified chronic kidney disease: Secondary | ICD-10-CM | POA: Diagnosis not present

## 2020-11-07 DIAGNOSIS — E1122 Type 2 diabetes mellitus with diabetic chronic kidney disease: Secondary | ICD-10-CM | POA: Diagnosis not present

## 2020-11-07 DIAGNOSIS — I87313 Chronic venous hypertension (idiopathic) with ulcer of bilateral lower extremity: Secondary | ICD-10-CM | POA: Diagnosis not present

## 2020-11-07 DIAGNOSIS — N183 Chronic kidney disease, stage 3 unspecified: Secondary | ICD-10-CM | POA: Diagnosis not present

## 2020-11-11 DIAGNOSIS — I87313 Chronic venous hypertension (idiopathic) with ulcer of bilateral lower extremity: Secondary | ICD-10-CM | POA: Diagnosis not present

## 2020-11-11 DIAGNOSIS — L97821 Non-pressure chronic ulcer of other part of left lower leg limited to breakdown of skin: Secondary | ICD-10-CM | POA: Diagnosis not present

## 2020-11-11 DIAGNOSIS — N183 Chronic kidney disease, stage 3 unspecified: Secondary | ICD-10-CM | POA: Diagnosis not present

## 2020-11-11 DIAGNOSIS — E1122 Type 2 diabetes mellitus with diabetic chronic kidney disease: Secondary | ICD-10-CM | POA: Diagnosis not present

## 2020-11-11 DIAGNOSIS — E1151 Type 2 diabetes mellitus with diabetic peripheral angiopathy without gangrene: Secondary | ICD-10-CM | POA: Diagnosis not present

## 2020-11-11 DIAGNOSIS — E1142 Type 2 diabetes mellitus with diabetic polyneuropathy: Secondary | ICD-10-CM | POA: Diagnosis not present

## 2020-11-11 DIAGNOSIS — Z7984 Long term (current) use of oral hypoglycemic drugs: Secondary | ICD-10-CM | POA: Diagnosis not present

## 2020-11-11 DIAGNOSIS — I129 Hypertensive chronic kidney disease with stage 1 through stage 4 chronic kidney disease, or unspecified chronic kidney disease: Secondary | ICD-10-CM | POA: Diagnosis not present

## 2020-11-11 DIAGNOSIS — I482 Chronic atrial fibrillation, unspecified: Secondary | ICD-10-CM | POA: Diagnosis not present

## 2020-11-13 DIAGNOSIS — E1142 Type 2 diabetes mellitus with diabetic polyneuropathy: Secondary | ICD-10-CM | POA: Diagnosis not present

## 2020-11-13 DIAGNOSIS — I10 Essential (primary) hypertension: Secondary | ICD-10-CM | POA: Diagnosis not present

## 2020-11-13 DIAGNOSIS — E1165 Type 2 diabetes mellitus with hyperglycemia: Secondary | ICD-10-CM | POA: Diagnosis not present

## 2020-11-13 DIAGNOSIS — E782 Mixed hyperlipidemia: Secondary | ICD-10-CM | POA: Diagnosis not present

## 2020-11-13 DIAGNOSIS — K2100 Gastro-esophageal reflux disease with esophagitis, without bleeding: Secondary | ICD-10-CM | POA: Diagnosis not present

## 2020-11-13 DIAGNOSIS — E7849 Other hyperlipidemia: Secondary | ICD-10-CM | POA: Diagnosis not present

## 2020-11-14 DIAGNOSIS — I87313 Chronic venous hypertension (idiopathic) with ulcer of bilateral lower extremity: Secondary | ICD-10-CM | POA: Diagnosis not present

## 2020-11-14 DIAGNOSIS — Z7984 Long term (current) use of oral hypoglycemic drugs: Secondary | ICD-10-CM | POA: Diagnosis not present

## 2020-11-14 DIAGNOSIS — I482 Chronic atrial fibrillation, unspecified: Secondary | ICD-10-CM | POA: Diagnosis not present

## 2020-11-14 DIAGNOSIS — E1122 Type 2 diabetes mellitus with diabetic chronic kidney disease: Secondary | ICD-10-CM | POA: Diagnosis not present

## 2020-11-14 DIAGNOSIS — E1142 Type 2 diabetes mellitus with diabetic polyneuropathy: Secondary | ICD-10-CM | POA: Diagnosis not present

## 2020-11-14 DIAGNOSIS — E1151 Type 2 diabetes mellitus with diabetic peripheral angiopathy without gangrene: Secondary | ICD-10-CM | POA: Diagnosis not present

## 2020-11-14 DIAGNOSIS — L97821 Non-pressure chronic ulcer of other part of left lower leg limited to breakdown of skin: Secondary | ICD-10-CM | POA: Diagnosis not present

## 2020-11-14 DIAGNOSIS — I129 Hypertensive chronic kidney disease with stage 1 through stage 4 chronic kidney disease, or unspecified chronic kidney disease: Secondary | ICD-10-CM | POA: Diagnosis not present

## 2020-11-14 DIAGNOSIS — N183 Chronic kidney disease, stage 3 unspecified: Secondary | ICD-10-CM | POA: Diagnosis not present

## 2020-11-17 DIAGNOSIS — E1165 Type 2 diabetes mellitus with hyperglycemia: Secondary | ICD-10-CM | POA: Diagnosis not present

## 2020-11-17 DIAGNOSIS — N183 Chronic kidney disease, stage 3 unspecified: Secondary | ICD-10-CM | POA: Diagnosis not present

## 2020-11-17 DIAGNOSIS — K2100 Gastro-esophageal reflux disease with esophagitis, without bleeding: Secondary | ICD-10-CM | POA: Diagnosis not present

## 2020-11-17 DIAGNOSIS — E039 Hypothyroidism, unspecified: Secondary | ICD-10-CM | POA: Diagnosis not present

## 2020-11-17 DIAGNOSIS — I10 Essential (primary) hypertension: Secondary | ICD-10-CM | POA: Diagnosis not present

## 2020-11-18 DIAGNOSIS — E1151 Type 2 diabetes mellitus with diabetic peripheral angiopathy without gangrene: Secondary | ICD-10-CM | POA: Diagnosis not present

## 2020-11-18 DIAGNOSIS — I10 Essential (primary) hypertension: Secondary | ICD-10-CM | POA: Diagnosis not present

## 2020-11-18 DIAGNOSIS — E538 Deficiency of other specified B group vitamins: Secondary | ICD-10-CM | POA: Diagnosis not present

## 2020-11-18 DIAGNOSIS — G629 Polyneuropathy, unspecified: Secondary | ICD-10-CM | POA: Diagnosis not present

## 2020-11-18 DIAGNOSIS — E7849 Other hyperlipidemia: Secondary | ICD-10-CM | POA: Diagnosis not present

## 2020-11-18 DIAGNOSIS — I87313 Chronic venous hypertension (idiopathic) with ulcer of bilateral lower extremity: Secondary | ICD-10-CM | POA: Diagnosis not present

## 2020-11-18 DIAGNOSIS — I129 Hypertensive chronic kidney disease with stage 1 through stage 4 chronic kidney disease, or unspecified chronic kidney disease: Secondary | ICD-10-CM | POA: Diagnosis not present

## 2020-11-18 DIAGNOSIS — E1142 Type 2 diabetes mellitus with diabetic polyneuropathy: Secondary | ICD-10-CM | POA: Diagnosis not present

## 2020-11-18 DIAGNOSIS — L97909 Non-pressure chronic ulcer of unspecified part of unspecified lower leg with unspecified severity: Secondary | ICD-10-CM | POA: Diagnosis not present

## 2020-11-18 DIAGNOSIS — L97821 Non-pressure chronic ulcer of other part of left lower leg limited to breakdown of skin: Secondary | ICD-10-CM | POA: Diagnosis not present

## 2020-11-18 DIAGNOSIS — I8392 Asymptomatic varicose veins of left lower extremity: Secondary | ICD-10-CM | POA: Diagnosis not present

## 2020-11-18 DIAGNOSIS — E1122 Type 2 diabetes mellitus with diabetic chronic kidney disease: Secondary | ICD-10-CM | POA: Diagnosis not present

## 2020-11-18 DIAGNOSIS — N183 Chronic kidney disease, stage 3 unspecified: Secondary | ICD-10-CM | POA: Diagnosis not present

## 2020-11-18 DIAGNOSIS — Z7984 Long term (current) use of oral hypoglycemic drugs: Secondary | ICD-10-CM | POA: Diagnosis not present

## 2020-11-18 DIAGNOSIS — I482 Chronic atrial fibrillation, unspecified: Secondary | ICD-10-CM | POA: Diagnosis not present

## 2020-11-21 DIAGNOSIS — I129 Hypertensive chronic kidney disease with stage 1 through stage 4 chronic kidney disease, or unspecified chronic kidney disease: Secondary | ICD-10-CM | POA: Diagnosis not present

## 2020-11-21 DIAGNOSIS — N183 Chronic kidney disease, stage 3 unspecified: Secondary | ICD-10-CM | POA: Diagnosis not present

## 2020-11-21 DIAGNOSIS — I87313 Chronic venous hypertension (idiopathic) with ulcer of bilateral lower extremity: Secondary | ICD-10-CM | POA: Diagnosis not present

## 2020-11-21 DIAGNOSIS — E1122 Type 2 diabetes mellitus with diabetic chronic kidney disease: Secondary | ICD-10-CM | POA: Diagnosis not present

## 2020-11-21 DIAGNOSIS — L97821 Non-pressure chronic ulcer of other part of left lower leg limited to breakdown of skin: Secondary | ICD-10-CM | POA: Diagnosis not present

## 2020-11-21 DIAGNOSIS — Z7984 Long term (current) use of oral hypoglycemic drugs: Secondary | ICD-10-CM | POA: Diagnosis not present

## 2020-11-21 DIAGNOSIS — E1151 Type 2 diabetes mellitus with diabetic peripheral angiopathy without gangrene: Secondary | ICD-10-CM | POA: Diagnosis not present

## 2020-11-21 DIAGNOSIS — I482 Chronic atrial fibrillation, unspecified: Secondary | ICD-10-CM | POA: Diagnosis not present

## 2020-11-21 DIAGNOSIS — E1142 Type 2 diabetes mellitus with diabetic polyneuropathy: Secondary | ICD-10-CM | POA: Diagnosis not present

## 2020-11-25 DIAGNOSIS — I482 Chronic atrial fibrillation, unspecified: Secondary | ICD-10-CM | POA: Diagnosis not present

## 2020-11-25 DIAGNOSIS — L97821 Non-pressure chronic ulcer of other part of left lower leg limited to breakdown of skin: Secondary | ICD-10-CM | POA: Diagnosis not present

## 2020-11-25 DIAGNOSIS — I129 Hypertensive chronic kidney disease with stage 1 through stage 4 chronic kidney disease, or unspecified chronic kidney disease: Secondary | ICD-10-CM | POA: Diagnosis not present

## 2020-11-25 DIAGNOSIS — Z7984 Long term (current) use of oral hypoglycemic drugs: Secondary | ICD-10-CM | POA: Diagnosis not present

## 2020-11-25 DIAGNOSIS — E1122 Type 2 diabetes mellitus with diabetic chronic kidney disease: Secondary | ICD-10-CM | POA: Diagnosis not present

## 2020-11-25 DIAGNOSIS — I87313 Chronic venous hypertension (idiopathic) with ulcer of bilateral lower extremity: Secondary | ICD-10-CM | POA: Diagnosis not present

## 2020-11-25 DIAGNOSIS — N183 Chronic kidney disease, stage 3 unspecified: Secondary | ICD-10-CM | POA: Diagnosis not present

## 2020-11-25 DIAGNOSIS — E1142 Type 2 diabetes mellitus with diabetic polyneuropathy: Secondary | ICD-10-CM | POA: Diagnosis not present

## 2020-11-25 DIAGNOSIS — E1151 Type 2 diabetes mellitus with diabetic peripheral angiopathy without gangrene: Secondary | ICD-10-CM | POA: Diagnosis not present

## 2020-11-28 DIAGNOSIS — E1151 Type 2 diabetes mellitus with diabetic peripheral angiopathy without gangrene: Secondary | ICD-10-CM | POA: Diagnosis not present

## 2020-11-28 DIAGNOSIS — I129 Hypertensive chronic kidney disease with stage 1 through stage 4 chronic kidney disease, or unspecified chronic kidney disease: Secondary | ICD-10-CM | POA: Diagnosis not present

## 2020-11-28 DIAGNOSIS — I482 Chronic atrial fibrillation, unspecified: Secondary | ICD-10-CM | POA: Diagnosis not present

## 2020-11-28 DIAGNOSIS — L97821 Non-pressure chronic ulcer of other part of left lower leg limited to breakdown of skin: Secondary | ICD-10-CM | POA: Diagnosis not present

## 2020-11-28 DIAGNOSIS — Z7984 Long term (current) use of oral hypoglycemic drugs: Secondary | ICD-10-CM | POA: Diagnosis not present

## 2020-11-28 DIAGNOSIS — N183 Chronic kidney disease, stage 3 unspecified: Secondary | ICD-10-CM | POA: Diagnosis not present

## 2020-11-28 DIAGNOSIS — E1142 Type 2 diabetes mellitus with diabetic polyneuropathy: Secondary | ICD-10-CM | POA: Diagnosis not present

## 2020-11-28 DIAGNOSIS — I87313 Chronic venous hypertension (idiopathic) with ulcer of bilateral lower extremity: Secondary | ICD-10-CM | POA: Diagnosis not present

## 2020-11-28 DIAGNOSIS — E1122 Type 2 diabetes mellitus with diabetic chronic kidney disease: Secondary | ICD-10-CM | POA: Diagnosis not present

## 2020-12-02 DIAGNOSIS — I482 Chronic atrial fibrillation, unspecified: Secondary | ICD-10-CM | POA: Diagnosis not present

## 2020-12-02 DIAGNOSIS — E1142 Type 2 diabetes mellitus with diabetic polyneuropathy: Secondary | ICD-10-CM | POA: Diagnosis not present

## 2020-12-02 DIAGNOSIS — I87313 Chronic venous hypertension (idiopathic) with ulcer of bilateral lower extremity: Secondary | ICD-10-CM | POA: Diagnosis not present

## 2020-12-02 DIAGNOSIS — I129 Hypertensive chronic kidney disease with stage 1 through stage 4 chronic kidney disease, or unspecified chronic kidney disease: Secondary | ICD-10-CM | POA: Diagnosis not present

## 2020-12-02 DIAGNOSIS — N183 Chronic kidney disease, stage 3 unspecified: Secondary | ICD-10-CM | POA: Diagnosis not present

## 2020-12-02 DIAGNOSIS — Z7984 Long term (current) use of oral hypoglycemic drugs: Secondary | ICD-10-CM | POA: Diagnosis not present

## 2020-12-02 DIAGNOSIS — E1151 Type 2 diabetes mellitus with diabetic peripheral angiopathy without gangrene: Secondary | ICD-10-CM | POA: Diagnosis not present

## 2020-12-02 DIAGNOSIS — E1122 Type 2 diabetes mellitus with diabetic chronic kidney disease: Secondary | ICD-10-CM | POA: Diagnosis not present

## 2020-12-02 DIAGNOSIS — L97821 Non-pressure chronic ulcer of other part of left lower leg limited to breakdown of skin: Secondary | ICD-10-CM | POA: Diagnosis not present

## 2020-12-04 DIAGNOSIS — E1142 Type 2 diabetes mellitus with diabetic polyneuropathy: Secondary | ICD-10-CM | POA: Diagnosis not present

## 2020-12-04 DIAGNOSIS — I87313 Chronic venous hypertension (idiopathic) with ulcer of bilateral lower extremity: Secondary | ICD-10-CM | POA: Diagnosis not present

## 2020-12-04 DIAGNOSIS — I482 Chronic atrial fibrillation, unspecified: Secondary | ICD-10-CM | POA: Diagnosis not present

## 2020-12-04 DIAGNOSIS — L97821 Non-pressure chronic ulcer of other part of left lower leg limited to breakdown of skin: Secondary | ICD-10-CM | POA: Diagnosis not present

## 2020-12-04 DIAGNOSIS — N183 Chronic kidney disease, stage 3 unspecified: Secondary | ICD-10-CM | POA: Diagnosis not present

## 2020-12-04 DIAGNOSIS — E1151 Type 2 diabetes mellitus with diabetic peripheral angiopathy without gangrene: Secondary | ICD-10-CM | POA: Diagnosis not present

## 2020-12-04 DIAGNOSIS — Z7984 Long term (current) use of oral hypoglycemic drugs: Secondary | ICD-10-CM | POA: Diagnosis not present

## 2020-12-04 DIAGNOSIS — E1122 Type 2 diabetes mellitus with diabetic chronic kidney disease: Secondary | ICD-10-CM | POA: Diagnosis not present

## 2020-12-04 DIAGNOSIS — I129 Hypertensive chronic kidney disease with stage 1 through stage 4 chronic kidney disease, or unspecified chronic kidney disease: Secondary | ICD-10-CM | POA: Diagnosis not present

## 2020-12-09 DIAGNOSIS — I129 Hypertensive chronic kidney disease with stage 1 through stage 4 chronic kidney disease, or unspecified chronic kidney disease: Secondary | ICD-10-CM | POA: Diagnosis not present

## 2020-12-09 DIAGNOSIS — E1151 Type 2 diabetes mellitus with diabetic peripheral angiopathy without gangrene: Secondary | ICD-10-CM | POA: Diagnosis not present

## 2020-12-09 DIAGNOSIS — I87313 Chronic venous hypertension (idiopathic) with ulcer of bilateral lower extremity: Secondary | ICD-10-CM | POA: Diagnosis not present

## 2020-12-09 DIAGNOSIS — L97821 Non-pressure chronic ulcer of other part of left lower leg limited to breakdown of skin: Secondary | ICD-10-CM | POA: Diagnosis not present

## 2020-12-09 DIAGNOSIS — E1142 Type 2 diabetes mellitus with diabetic polyneuropathy: Secondary | ICD-10-CM | POA: Diagnosis not present

## 2020-12-09 DIAGNOSIS — E1122 Type 2 diabetes mellitus with diabetic chronic kidney disease: Secondary | ICD-10-CM | POA: Diagnosis not present

## 2020-12-09 DIAGNOSIS — I482 Chronic atrial fibrillation, unspecified: Secondary | ICD-10-CM | POA: Diagnosis not present

## 2020-12-09 DIAGNOSIS — Z7984 Long term (current) use of oral hypoglycemic drugs: Secondary | ICD-10-CM | POA: Diagnosis not present

## 2020-12-09 DIAGNOSIS — N183 Chronic kidney disease, stage 3 unspecified: Secondary | ICD-10-CM | POA: Diagnosis not present

## 2020-12-12 DIAGNOSIS — I129 Hypertensive chronic kidney disease with stage 1 through stage 4 chronic kidney disease, or unspecified chronic kidney disease: Secondary | ICD-10-CM | POA: Diagnosis not present

## 2020-12-12 DIAGNOSIS — I87313 Chronic venous hypertension (idiopathic) with ulcer of bilateral lower extremity: Secondary | ICD-10-CM | POA: Diagnosis not present

## 2020-12-12 DIAGNOSIS — Z7984 Long term (current) use of oral hypoglycemic drugs: Secondary | ICD-10-CM | POA: Diagnosis not present

## 2020-12-12 DIAGNOSIS — L97821 Non-pressure chronic ulcer of other part of left lower leg limited to breakdown of skin: Secondary | ICD-10-CM | POA: Diagnosis not present

## 2020-12-12 DIAGNOSIS — I482 Chronic atrial fibrillation, unspecified: Secondary | ICD-10-CM | POA: Diagnosis not present

## 2020-12-12 DIAGNOSIS — E1151 Type 2 diabetes mellitus with diabetic peripheral angiopathy without gangrene: Secondary | ICD-10-CM | POA: Diagnosis not present

## 2020-12-12 DIAGNOSIS — E1142 Type 2 diabetes mellitus with diabetic polyneuropathy: Secondary | ICD-10-CM | POA: Diagnosis not present

## 2020-12-12 DIAGNOSIS — N183 Chronic kidney disease, stage 3 unspecified: Secondary | ICD-10-CM | POA: Diagnosis not present

## 2020-12-12 DIAGNOSIS — E1122 Type 2 diabetes mellitus with diabetic chronic kidney disease: Secondary | ICD-10-CM | POA: Diagnosis not present

## 2020-12-15 DIAGNOSIS — N183 Chronic kidney disease, stage 3 unspecified: Secondary | ICD-10-CM | POA: Diagnosis not present

## 2020-12-15 DIAGNOSIS — E1142 Type 2 diabetes mellitus with diabetic polyneuropathy: Secondary | ICD-10-CM | POA: Diagnosis not present

## 2020-12-15 DIAGNOSIS — E1122 Type 2 diabetes mellitus with diabetic chronic kidney disease: Secondary | ICD-10-CM | POA: Diagnosis not present

## 2020-12-15 DIAGNOSIS — E1151 Type 2 diabetes mellitus with diabetic peripheral angiopathy without gangrene: Secondary | ICD-10-CM | POA: Diagnosis not present

## 2020-12-15 DIAGNOSIS — I87313 Chronic venous hypertension (idiopathic) with ulcer of bilateral lower extremity: Secondary | ICD-10-CM | POA: Diagnosis not present

## 2020-12-15 DIAGNOSIS — Z7984 Long term (current) use of oral hypoglycemic drugs: Secondary | ICD-10-CM | POA: Diagnosis not present

## 2020-12-15 DIAGNOSIS — I482 Chronic atrial fibrillation, unspecified: Secondary | ICD-10-CM | POA: Diagnosis not present

## 2020-12-15 DIAGNOSIS — L97821 Non-pressure chronic ulcer of other part of left lower leg limited to breakdown of skin: Secondary | ICD-10-CM | POA: Diagnosis not present

## 2020-12-15 DIAGNOSIS — I129 Hypertensive chronic kidney disease with stage 1 through stage 4 chronic kidney disease, or unspecified chronic kidney disease: Secondary | ICD-10-CM | POA: Diagnosis not present

## 2020-12-17 DIAGNOSIS — E1165 Type 2 diabetes mellitus with hyperglycemia: Secondary | ICD-10-CM | POA: Diagnosis not present

## 2020-12-17 DIAGNOSIS — K2100 Gastro-esophageal reflux disease with esophagitis, without bleeding: Secondary | ICD-10-CM | POA: Diagnosis not present

## 2020-12-17 DIAGNOSIS — L97821 Non-pressure chronic ulcer of other part of left lower leg limited to breakdown of skin: Secondary | ICD-10-CM | POA: Diagnosis not present

## 2020-12-17 DIAGNOSIS — N183 Chronic kidney disease, stage 3 unspecified: Secondary | ICD-10-CM | POA: Diagnosis not present

## 2020-12-17 DIAGNOSIS — E039 Hypothyroidism, unspecified: Secondary | ICD-10-CM | POA: Diagnosis not present

## 2020-12-17 DIAGNOSIS — I10 Essential (primary) hypertension: Secondary | ICD-10-CM | POA: Diagnosis not present

## 2020-12-18 DIAGNOSIS — L97821 Non-pressure chronic ulcer of other part of left lower leg limited to breakdown of skin: Secondary | ICD-10-CM | POA: Diagnosis not present

## 2020-12-18 DIAGNOSIS — Z7984 Long term (current) use of oral hypoglycemic drugs: Secondary | ICD-10-CM | POA: Diagnosis not present

## 2020-12-18 DIAGNOSIS — E1142 Type 2 diabetes mellitus with diabetic polyneuropathy: Secondary | ICD-10-CM | POA: Diagnosis not present

## 2020-12-18 DIAGNOSIS — I129 Hypertensive chronic kidney disease with stage 1 through stage 4 chronic kidney disease, or unspecified chronic kidney disease: Secondary | ICD-10-CM | POA: Diagnosis not present

## 2020-12-18 DIAGNOSIS — E1151 Type 2 diabetes mellitus with diabetic peripheral angiopathy without gangrene: Secondary | ICD-10-CM | POA: Diagnosis not present

## 2020-12-18 DIAGNOSIS — N183 Chronic kidney disease, stage 3 unspecified: Secondary | ICD-10-CM | POA: Diagnosis not present

## 2020-12-18 DIAGNOSIS — I482 Chronic atrial fibrillation, unspecified: Secondary | ICD-10-CM | POA: Diagnosis not present

## 2020-12-18 DIAGNOSIS — E1122 Type 2 diabetes mellitus with diabetic chronic kidney disease: Secondary | ICD-10-CM | POA: Diagnosis not present

## 2020-12-18 DIAGNOSIS — I87313 Chronic venous hypertension (idiopathic) with ulcer of bilateral lower extremity: Secondary | ICD-10-CM | POA: Diagnosis not present

## 2020-12-23 DIAGNOSIS — E1122 Type 2 diabetes mellitus with diabetic chronic kidney disease: Secondary | ICD-10-CM | POA: Diagnosis not present

## 2020-12-23 DIAGNOSIS — E1142 Type 2 diabetes mellitus with diabetic polyneuropathy: Secondary | ICD-10-CM | POA: Diagnosis not present

## 2020-12-23 DIAGNOSIS — L97821 Non-pressure chronic ulcer of other part of left lower leg limited to breakdown of skin: Secondary | ICD-10-CM | POA: Diagnosis not present

## 2020-12-23 DIAGNOSIS — I482 Chronic atrial fibrillation, unspecified: Secondary | ICD-10-CM | POA: Diagnosis not present

## 2020-12-23 DIAGNOSIS — E1140 Type 2 diabetes mellitus with diabetic neuropathy, unspecified: Secondary | ICD-10-CM | POA: Diagnosis not present

## 2020-12-23 DIAGNOSIS — N183 Chronic kidney disease, stage 3 unspecified: Secondary | ICD-10-CM | POA: Diagnosis not present

## 2020-12-23 DIAGNOSIS — Z7984 Long term (current) use of oral hypoglycemic drugs: Secondary | ICD-10-CM | POA: Diagnosis not present

## 2020-12-23 DIAGNOSIS — I87313 Chronic venous hypertension (idiopathic) with ulcer of bilateral lower extremity: Secondary | ICD-10-CM | POA: Diagnosis not present

## 2020-12-23 DIAGNOSIS — E1151 Type 2 diabetes mellitus with diabetic peripheral angiopathy without gangrene: Secondary | ICD-10-CM | POA: Diagnosis not present

## 2020-12-23 DIAGNOSIS — I129 Hypertensive chronic kidney disease with stage 1 through stage 4 chronic kidney disease, or unspecified chronic kidney disease: Secondary | ICD-10-CM | POA: Diagnosis not present

## 2020-12-24 DIAGNOSIS — L97821 Non-pressure chronic ulcer of other part of left lower leg limited to breakdown of skin: Secondary | ICD-10-CM | POA: Diagnosis not present

## 2020-12-26 DIAGNOSIS — I129 Hypertensive chronic kidney disease with stage 1 through stage 4 chronic kidney disease, or unspecified chronic kidney disease: Secondary | ICD-10-CM | POA: Diagnosis not present

## 2020-12-26 DIAGNOSIS — L97821 Non-pressure chronic ulcer of other part of left lower leg limited to breakdown of skin: Secondary | ICD-10-CM | POA: Diagnosis not present

## 2020-12-26 DIAGNOSIS — N183 Chronic kidney disease, stage 3 unspecified: Secondary | ICD-10-CM | POA: Diagnosis not present

## 2020-12-26 DIAGNOSIS — I482 Chronic atrial fibrillation, unspecified: Secondary | ICD-10-CM | POA: Diagnosis not present

## 2020-12-26 DIAGNOSIS — E1142 Type 2 diabetes mellitus with diabetic polyneuropathy: Secondary | ICD-10-CM | POA: Diagnosis not present

## 2020-12-26 DIAGNOSIS — Z7984 Long term (current) use of oral hypoglycemic drugs: Secondary | ICD-10-CM | POA: Diagnosis not present

## 2020-12-26 DIAGNOSIS — E1122 Type 2 diabetes mellitus with diabetic chronic kidney disease: Secondary | ICD-10-CM | POA: Diagnosis not present

## 2020-12-26 DIAGNOSIS — E1151 Type 2 diabetes mellitus with diabetic peripheral angiopathy without gangrene: Secondary | ICD-10-CM | POA: Diagnosis not present

## 2020-12-26 DIAGNOSIS — I87313 Chronic venous hypertension (idiopathic) with ulcer of bilateral lower extremity: Secondary | ICD-10-CM | POA: Diagnosis not present

## 2020-12-29 DIAGNOSIS — L97821 Non-pressure chronic ulcer of other part of left lower leg limited to breakdown of skin: Secondary | ICD-10-CM | POA: Diagnosis not present

## 2020-12-30 DIAGNOSIS — E1142 Type 2 diabetes mellitus with diabetic polyneuropathy: Secondary | ICD-10-CM | POA: Diagnosis not present

## 2020-12-30 DIAGNOSIS — I87313 Chronic venous hypertension (idiopathic) with ulcer of bilateral lower extremity: Secondary | ICD-10-CM | POA: Diagnosis not present

## 2020-12-30 DIAGNOSIS — E1122 Type 2 diabetes mellitus with diabetic chronic kidney disease: Secondary | ICD-10-CM | POA: Diagnosis not present

## 2020-12-30 DIAGNOSIS — I482 Chronic atrial fibrillation, unspecified: Secondary | ICD-10-CM | POA: Diagnosis not present

## 2020-12-30 DIAGNOSIS — E1151 Type 2 diabetes mellitus with diabetic peripheral angiopathy without gangrene: Secondary | ICD-10-CM | POA: Diagnosis not present

## 2020-12-30 DIAGNOSIS — I129 Hypertensive chronic kidney disease with stage 1 through stage 4 chronic kidney disease, or unspecified chronic kidney disease: Secondary | ICD-10-CM | POA: Diagnosis not present

## 2020-12-30 DIAGNOSIS — L97821 Non-pressure chronic ulcer of other part of left lower leg limited to breakdown of skin: Secondary | ICD-10-CM | POA: Diagnosis not present

## 2020-12-30 DIAGNOSIS — N183 Chronic kidney disease, stage 3 unspecified: Secondary | ICD-10-CM | POA: Diagnosis not present

## 2020-12-30 DIAGNOSIS — Z7984 Long term (current) use of oral hypoglycemic drugs: Secondary | ICD-10-CM | POA: Diagnosis not present

## 2021-01-02 DIAGNOSIS — L97821 Non-pressure chronic ulcer of other part of left lower leg limited to breakdown of skin: Secondary | ICD-10-CM | POA: Diagnosis not present

## 2021-01-02 DIAGNOSIS — I129 Hypertensive chronic kidney disease with stage 1 through stage 4 chronic kidney disease, or unspecified chronic kidney disease: Secondary | ICD-10-CM | POA: Diagnosis not present

## 2021-01-02 DIAGNOSIS — N183 Chronic kidney disease, stage 3 unspecified: Secondary | ICD-10-CM | POA: Diagnosis not present

## 2021-01-02 DIAGNOSIS — I87313 Chronic venous hypertension (idiopathic) with ulcer of bilateral lower extremity: Secondary | ICD-10-CM | POA: Diagnosis not present

## 2021-01-02 DIAGNOSIS — Z7984 Long term (current) use of oral hypoglycemic drugs: Secondary | ICD-10-CM | POA: Diagnosis not present

## 2021-01-02 DIAGNOSIS — E1142 Type 2 diabetes mellitus with diabetic polyneuropathy: Secondary | ICD-10-CM | POA: Diagnosis not present

## 2021-01-02 DIAGNOSIS — E1122 Type 2 diabetes mellitus with diabetic chronic kidney disease: Secondary | ICD-10-CM | POA: Diagnosis not present

## 2021-01-02 DIAGNOSIS — E1151 Type 2 diabetes mellitus with diabetic peripheral angiopathy without gangrene: Secondary | ICD-10-CM | POA: Diagnosis not present

## 2021-01-02 DIAGNOSIS — I482 Chronic atrial fibrillation, unspecified: Secondary | ICD-10-CM | POA: Diagnosis not present

## 2021-01-06 DIAGNOSIS — N183 Chronic kidney disease, stage 3 unspecified: Secondary | ICD-10-CM | POA: Diagnosis not present

## 2021-01-06 DIAGNOSIS — E1151 Type 2 diabetes mellitus with diabetic peripheral angiopathy without gangrene: Secondary | ICD-10-CM | POA: Diagnosis not present

## 2021-01-06 DIAGNOSIS — L97821 Non-pressure chronic ulcer of other part of left lower leg limited to breakdown of skin: Secondary | ICD-10-CM | POA: Diagnosis not present

## 2021-01-06 DIAGNOSIS — I129 Hypertensive chronic kidney disease with stage 1 through stage 4 chronic kidney disease, or unspecified chronic kidney disease: Secondary | ICD-10-CM | POA: Diagnosis not present

## 2021-01-06 DIAGNOSIS — I482 Chronic atrial fibrillation, unspecified: Secondary | ICD-10-CM | POA: Diagnosis not present

## 2021-01-06 DIAGNOSIS — Z7984 Long term (current) use of oral hypoglycemic drugs: Secondary | ICD-10-CM | POA: Diagnosis not present

## 2021-01-06 DIAGNOSIS — E1142 Type 2 diabetes mellitus with diabetic polyneuropathy: Secondary | ICD-10-CM | POA: Diagnosis not present

## 2021-01-06 DIAGNOSIS — I87313 Chronic venous hypertension (idiopathic) with ulcer of bilateral lower extremity: Secondary | ICD-10-CM | POA: Diagnosis not present

## 2021-01-06 DIAGNOSIS — E1122 Type 2 diabetes mellitus with diabetic chronic kidney disease: Secondary | ICD-10-CM | POA: Diagnosis not present

## 2021-01-09 DIAGNOSIS — I87313 Chronic venous hypertension (idiopathic) with ulcer of bilateral lower extremity: Secondary | ICD-10-CM | POA: Diagnosis not present

## 2021-01-09 DIAGNOSIS — E1151 Type 2 diabetes mellitus with diabetic peripheral angiopathy without gangrene: Secondary | ICD-10-CM | POA: Diagnosis not present

## 2021-01-09 DIAGNOSIS — I129 Hypertensive chronic kidney disease with stage 1 through stage 4 chronic kidney disease, or unspecified chronic kidney disease: Secondary | ICD-10-CM | POA: Diagnosis not present

## 2021-01-09 DIAGNOSIS — E1122 Type 2 diabetes mellitus with diabetic chronic kidney disease: Secondary | ICD-10-CM | POA: Diagnosis not present

## 2021-01-09 DIAGNOSIS — Z7984 Long term (current) use of oral hypoglycemic drugs: Secondary | ICD-10-CM | POA: Diagnosis not present

## 2021-01-09 DIAGNOSIS — I482 Chronic atrial fibrillation, unspecified: Secondary | ICD-10-CM | POA: Diagnosis not present

## 2021-01-09 DIAGNOSIS — L97821 Non-pressure chronic ulcer of other part of left lower leg limited to breakdown of skin: Secondary | ICD-10-CM | POA: Diagnosis not present

## 2021-01-09 DIAGNOSIS — N183 Chronic kidney disease, stage 3 unspecified: Secondary | ICD-10-CM | POA: Diagnosis not present

## 2021-01-09 DIAGNOSIS — E1142 Type 2 diabetes mellitus with diabetic polyneuropathy: Secondary | ICD-10-CM | POA: Diagnosis not present

## 2021-01-15 DIAGNOSIS — E1142 Type 2 diabetes mellitus with diabetic polyneuropathy: Secondary | ICD-10-CM | POA: Diagnosis not present

## 2021-01-15 DIAGNOSIS — N183 Chronic kidney disease, stage 3 unspecified: Secondary | ICD-10-CM | POA: Diagnosis not present

## 2021-01-15 DIAGNOSIS — I482 Chronic atrial fibrillation, unspecified: Secondary | ICD-10-CM | POA: Diagnosis not present

## 2021-01-15 DIAGNOSIS — L97821 Non-pressure chronic ulcer of other part of left lower leg limited to breakdown of skin: Secondary | ICD-10-CM | POA: Diagnosis not present

## 2021-01-15 DIAGNOSIS — I129 Hypertensive chronic kidney disease with stage 1 through stage 4 chronic kidney disease, or unspecified chronic kidney disease: Secondary | ICD-10-CM | POA: Diagnosis not present

## 2021-01-15 DIAGNOSIS — E1151 Type 2 diabetes mellitus with diabetic peripheral angiopathy without gangrene: Secondary | ICD-10-CM | POA: Diagnosis not present

## 2021-01-15 DIAGNOSIS — I87313 Chronic venous hypertension (idiopathic) with ulcer of bilateral lower extremity: Secondary | ICD-10-CM | POA: Diagnosis not present

## 2021-01-15 DIAGNOSIS — Z7984 Long term (current) use of oral hypoglycemic drugs: Secondary | ICD-10-CM | POA: Diagnosis not present

## 2021-01-15 DIAGNOSIS — E1122 Type 2 diabetes mellitus with diabetic chronic kidney disease: Secondary | ICD-10-CM | POA: Diagnosis not present

## 2021-01-19 DIAGNOSIS — Z7984 Long term (current) use of oral hypoglycemic drugs: Secondary | ICD-10-CM | POA: Diagnosis not present

## 2021-01-19 DIAGNOSIS — E1142 Type 2 diabetes mellitus with diabetic polyneuropathy: Secondary | ICD-10-CM | POA: Diagnosis not present

## 2021-01-19 DIAGNOSIS — I482 Chronic atrial fibrillation, unspecified: Secondary | ICD-10-CM | POA: Diagnosis not present

## 2021-01-19 DIAGNOSIS — N183 Chronic kidney disease, stage 3 unspecified: Secondary | ICD-10-CM | POA: Diagnosis not present

## 2021-01-19 DIAGNOSIS — L97821 Non-pressure chronic ulcer of other part of left lower leg limited to breakdown of skin: Secondary | ICD-10-CM | POA: Diagnosis not present

## 2021-01-19 DIAGNOSIS — E1122 Type 2 diabetes mellitus with diabetic chronic kidney disease: Secondary | ICD-10-CM | POA: Diagnosis not present

## 2021-01-19 DIAGNOSIS — I129 Hypertensive chronic kidney disease with stage 1 through stage 4 chronic kidney disease, or unspecified chronic kidney disease: Secondary | ICD-10-CM | POA: Diagnosis not present

## 2021-01-19 DIAGNOSIS — I87313 Chronic venous hypertension (idiopathic) with ulcer of bilateral lower extremity: Secondary | ICD-10-CM | POA: Diagnosis not present

## 2021-01-19 DIAGNOSIS — E1151 Type 2 diabetes mellitus with diabetic peripheral angiopathy without gangrene: Secondary | ICD-10-CM | POA: Diagnosis not present

## 2021-01-26 DIAGNOSIS — N183 Chronic kidney disease, stage 3 unspecified: Secondary | ICD-10-CM | POA: Diagnosis not present

## 2021-01-26 DIAGNOSIS — I87313 Chronic venous hypertension (idiopathic) with ulcer of bilateral lower extremity: Secondary | ICD-10-CM | POA: Diagnosis not present

## 2021-01-26 DIAGNOSIS — Z7984 Long term (current) use of oral hypoglycemic drugs: Secondary | ICD-10-CM | POA: Diagnosis not present

## 2021-01-26 DIAGNOSIS — E1151 Type 2 diabetes mellitus with diabetic peripheral angiopathy without gangrene: Secondary | ICD-10-CM | POA: Diagnosis not present

## 2021-01-26 DIAGNOSIS — I482 Chronic atrial fibrillation, unspecified: Secondary | ICD-10-CM | POA: Diagnosis not present

## 2021-01-26 DIAGNOSIS — I129 Hypertensive chronic kidney disease with stage 1 through stage 4 chronic kidney disease, or unspecified chronic kidney disease: Secondary | ICD-10-CM | POA: Diagnosis not present

## 2021-01-26 DIAGNOSIS — L97821 Non-pressure chronic ulcer of other part of left lower leg limited to breakdown of skin: Secondary | ICD-10-CM | POA: Diagnosis not present

## 2021-01-26 DIAGNOSIS — E1142 Type 2 diabetes mellitus with diabetic polyneuropathy: Secondary | ICD-10-CM | POA: Diagnosis not present

## 2021-01-26 DIAGNOSIS — E1122 Type 2 diabetes mellitus with diabetic chronic kidney disease: Secondary | ICD-10-CM | POA: Diagnosis not present

## 2021-01-30 DIAGNOSIS — I482 Chronic atrial fibrillation, unspecified: Secondary | ICD-10-CM | POA: Diagnosis not present

## 2021-01-30 DIAGNOSIS — E1122 Type 2 diabetes mellitus with diabetic chronic kidney disease: Secondary | ICD-10-CM | POA: Diagnosis not present

## 2021-01-30 DIAGNOSIS — Z7984 Long term (current) use of oral hypoglycemic drugs: Secondary | ICD-10-CM | POA: Diagnosis not present

## 2021-01-30 DIAGNOSIS — E1151 Type 2 diabetes mellitus with diabetic peripheral angiopathy without gangrene: Secondary | ICD-10-CM | POA: Diagnosis not present

## 2021-01-30 DIAGNOSIS — E1142 Type 2 diabetes mellitus with diabetic polyneuropathy: Secondary | ICD-10-CM | POA: Diagnosis not present

## 2021-01-30 DIAGNOSIS — I129 Hypertensive chronic kidney disease with stage 1 through stage 4 chronic kidney disease, or unspecified chronic kidney disease: Secondary | ICD-10-CM | POA: Diagnosis not present

## 2021-01-30 DIAGNOSIS — L97821 Non-pressure chronic ulcer of other part of left lower leg limited to breakdown of skin: Secondary | ICD-10-CM | POA: Diagnosis not present

## 2021-01-30 DIAGNOSIS — N183 Chronic kidney disease, stage 3 unspecified: Secondary | ICD-10-CM | POA: Diagnosis not present

## 2021-01-30 DIAGNOSIS — I87313 Chronic venous hypertension (idiopathic) with ulcer of bilateral lower extremity: Secondary | ICD-10-CM | POA: Diagnosis not present

## 2021-02-02 DIAGNOSIS — N183 Chronic kidney disease, stage 3 unspecified: Secondary | ICD-10-CM | POA: Diagnosis not present

## 2021-02-02 DIAGNOSIS — I129 Hypertensive chronic kidney disease with stage 1 through stage 4 chronic kidney disease, or unspecified chronic kidney disease: Secondary | ICD-10-CM | POA: Diagnosis not present

## 2021-02-02 DIAGNOSIS — L97821 Non-pressure chronic ulcer of other part of left lower leg limited to breakdown of skin: Secondary | ICD-10-CM | POA: Diagnosis not present

## 2021-02-02 DIAGNOSIS — I482 Chronic atrial fibrillation, unspecified: Secondary | ICD-10-CM | POA: Diagnosis not present

## 2021-02-02 DIAGNOSIS — Z7984 Long term (current) use of oral hypoglycemic drugs: Secondary | ICD-10-CM | POA: Diagnosis not present

## 2021-02-02 DIAGNOSIS — E1122 Type 2 diabetes mellitus with diabetic chronic kidney disease: Secondary | ICD-10-CM | POA: Diagnosis not present

## 2021-02-02 DIAGNOSIS — E1151 Type 2 diabetes mellitus with diabetic peripheral angiopathy without gangrene: Secondary | ICD-10-CM | POA: Diagnosis not present

## 2021-02-02 DIAGNOSIS — I87313 Chronic venous hypertension (idiopathic) with ulcer of bilateral lower extremity: Secondary | ICD-10-CM | POA: Diagnosis not present

## 2021-02-02 DIAGNOSIS — E1142 Type 2 diabetes mellitus with diabetic polyneuropathy: Secondary | ICD-10-CM | POA: Diagnosis not present

## 2021-02-05 DIAGNOSIS — Z7984 Long term (current) use of oral hypoglycemic drugs: Secondary | ICD-10-CM | POA: Diagnosis not present

## 2021-02-05 DIAGNOSIS — I129 Hypertensive chronic kidney disease with stage 1 through stage 4 chronic kidney disease, or unspecified chronic kidney disease: Secondary | ICD-10-CM | POA: Diagnosis not present

## 2021-02-05 DIAGNOSIS — I87313 Chronic venous hypertension (idiopathic) with ulcer of bilateral lower extremity: Secondary | ICD-10-CM | POA: Diagnosis not present

## 2021-02-05 DIAGNOSIS — N183 Chronic kidney disease, stage 3 unspecified: Secondary | ICD-10-CM | POA: Diagnosis not present

## 2021-02-05 DIAGNOSIS — E1122 Type 2 diabetes mellitus with diabetic chronic kidney disease: Secondary | ICD-10-CM | POA: Diagnosis not present

## 2021-02-05 DIAGNOSIS — E1142 Type 2 diabetes mellitus with diabetic polyneuropathy: Secondary | ICD-10-CM | POA: Diagnosis not present

## 2021-02-05 DIAGNOSIS — E1151 Type 2 diabetes mellitus with diabetic peripheral angiopathy without gangrene: Secondary | ICD-10-CM | POA: Diagnosis not present

## 2021-02-05 DIAGNOSIS — L97821 Non-pressure chronic ulcer of other part of left lower leg limited to breakdown of skin: Secondary | ICD-10-CM | POA: Diagnosis not present

## 2021-02-05 DIAGNOSIS — I482 Chronic atrial fibrillation, unspecified: Secondary | ICD-10-CM | POA: Diagnosis not present

## 2021-02-09 DIAGNOSIS — L97821 Non-pressure chronic ulcer of other part of left lower leg limited to breakdown of skin: Secondary | ICD-10-CM | POA: Diagnosis not present

## 2021-02-09 DIAGNOSIS — E1151 Type 2 diabetes mellitus with diabetic peripheral angiopathy without gangrene: Secondary | ICD-10-CM | POA: Diagnosis not present

## 2021-02-09 DIAGNOSIS — I87313 Chronic venous hypertension (idiopathic) with ulcer of bilateral lower extremity: Secondary | ICD-10-CM | POA: Diagnosis not present

## 2021-02-09 DIAGNOSIS — N183 Chronic kidney disease, stage 3 unspecified: Secondary | ICD-10-CM | POA: Diagnosis not present

## 2021-02-09 DIAGNOSIS — E1122 Type 2 diabetes mellitus with diabetic chronic kidney disease: Secondary | ICD-10-CM | POA: Diagnosis not present

## 2021-02-09 DIAGNOSIS — I482 Chronic atrial fibrillation, unspecified: Secondary | ICD-10-CM | POA: Diagnosis not present

## 2021-02-09 DIAGNOSIS — I129 Hypertensive chronic kidney disease with stage 1 through stage 4 chronic kidney disease, or unspecified chronic kidney disease: Secondary | ICD-10-CM | POA: Diagnosis not present

## 2021-02-09 DIAGNOSIS — E1142 Type 2 diabetes mellitus with diabetic polyneuropathy: Secondary | ICD-10-CM | POA: Diagnosis not present

## 2021-02-09 DIAGNOSIS — Z7984 Long term (current) use of oral hypoglycemic drugs: Secondary | ICD-10-CM | POA: Diagnosis not present

## 2021-02-13 DIAGNOSIS — L97821 Non-pressure chronic ulcer of other part of left lower leg limited to breakdown of skin: Secondary | ICD-10-CM | POA: Diagnosis not present

## 2021-02-13 DIAGNOSIS — I482 Chronic atrial fibrillation, unspecified: Secondary | ICD-10-CM | POA: Diagnosis not present

## 2021-02-13 DIAGNOSIS — Z7984 Long term (current) use of oral hypoglycemic drugs: Secondary | ICD-10-CM | POA: Diagnosis not present

## 2021-02-13 DIAGNOSIS — E1151 Type 2 diabetes mellitus with diabetic peripheral angiopathy without gangrene: Secondary | ICD-10-CM | POA: Diagnosis not present

## 2021-02-13 DIAGNOSIS — I129 Hypertensive chronic kidney disease with stage 1 through stage 4 chronic kidney disease, or unspecified chronic kidney disease: Secondary | ICD-10-CM | POA: Diagnosis not present

## 2021-02-13 DIAGNOSIS — E1142 Type 2 diabetes mellitus with diabetic polyneuropathy: Secondary | ICD-10-CM | POA: Diagnosis not present

## 2021-02-13 DIAGNOSIS — N183 Chronic kidney disease, stage 3 unspecified: Secondary | ICD-10-CM | POA: Diagnosis not present

## 2021-02-13 DIAGNOSIS — E1122 Type 2 diabetes mellitus with diabetic chronic kidney disease: Secondary | ICD-10-CM | POA: Diagnosis not present

## 2021-02-13 DIAGNOSIS — I87313 Chronic venous hypertension (idiopathic) with ulcer of bilateral lower extremity: Secondary | ICD-10-CM | POA: Diagnosis not present

## 2021-02-15 DIAGNOSIS — E039 Hypothyroidism, unspecified: Secondary | ICD-10-CM | POA: Diagnosis not present

## 2021-02-15 DIAGNOSIS — I10 Essential (primary) hypertension: Secondary | ICD-10-CM | POA: Diagnosis not present

## 2021-02-15 DIAGNOSIS — E1165 Type 2 diabetes mellitus with hyperglycemia: Secondary | ICD-10-CM | POA: Diagnosis not present

## 2021-02-16 DIAGNOSIS — L97821 Non-pressure chronic ulcer of other part of left lower leg limited to breakdown of skin: Secondary | ICD-10-CM | POA: Diagnosis not present

## 2021-02-16 DIAGNOSIS — N183 Chronic kidney disease, stage 3 unspecified: Secondary | ICD-10-CM | POA: Diagnosis not present

## 2021-02-16 DIAGNOSIS — I482 Chronic atrial fibrillation, unspecified: Secondary | ICD-10-CM | POA: Diagnosis not present

## 2021-02-16 DIAGNOSIS — I87313 Chronic venous hypertension (idiopathic) with ulcer of bilateral lower extremity: Secondary | ICD-10-CM | POA: Diagnosis not present

## 2021-02-16 DIAGNOSIS — I129 Hypertensive chronic kidney disease with stage 1 through stage 4 chronic kidney disease, or unspecified chronic kidney disease: Secondary | ICD-10-CM | POA: Diagnosis not present

## 2021-02-16 DIAGNOSIS — Z7984 Long term (current) use of oral hypoglycemic drugs: Secondary | ICD-10-CM | POA: Diagnosis not present

## 2021-02-16 DIAGNOSIS — E1122 Type 2 diabetes mellitus with diabetic chronic kidney disease: Secondary | ICD-10-CM | POA: Diagnosis not present

## 2021-02-16 DIAGNOSIS — E1151 Type 2 diabetes mellitus with diabetic peripheral angiopathy without gangrene: Secondary | ICD-10-CM | POA: Diagnosis not present

## 2021-02-16 DIAGNOSIS — E1142 Type 2 diabetes mellitus with diabetic polyneuropathy: Secondary | ICD-10-CM | POA: Diagnosis not present

## 2021-02-19 DIAGNOSIS — E083293 Diabetes mellitus due to underlying condition with mild nonproliferative diabetic retinopathy without macular edema, bilateral: Secondary | ICD-10-CM | POA: Diagnosis not present

## 2021-02-23 DIAGNOSIS — L97829 Non-pressure chronic ulcer of other part of left lower leg with unspecified severity: Secondary | ICD-10-CM | POA: Diagnosis not present

## 2021-02-23 DIAGNOSIS — I87301 Chronic venous hypertension (idiopathic) without complications of right lower extremity: Secondary | ICD-10-CM | POA: Diagnosis not present

## 2021-02-23 DIAGNOSIS — E1142 Type 2 diabetes mellitus with diabetic polyneuropathy: Secondary | ICD-10-CM | POA: Diagnosis not present

## 2021-02-23 DIAGNOSIS — I87312 Chronic venous hypertension (idiopathic) with ulcer of left lower extremity: Secondary | ICD-10-CM | POA: Diagnosis not present

## 2021-02-23 DIAGNOSIS — N183 Chronic kidney disease, stage 3 unspecified: Secondary | ICD-10-CM | POA: Diagnosis not present

## 2021-02-23 DIAGNOSIS — E1151 Type 2 diabetes mellitus with diabetic peripheral angiopathy without gangrene: Secondary | ICD-10-CM | POA: Diagnosis not present

## 2021-02-23 DIAGNOSIS — E1122 Type 2 diabetes mellitus with diabetic chronic kidney disease: Secondary | ICD-10-CM | POA: Diagnosis not present

## 2021-02-23 DIAGNOSIS — I129 Hypertensive chronic kidney disease with stage 1 through stage 4 chronic kidney disease, or unspecified chronic kidney disease: Secondary | ICD-10-CM | POA: Diagnosis not present

## 2021-02-23 DIAGNOSIS — Z7984 Long term (current) use of oral hypoglycemic drugs: Secondary | ICD-10-CM | POA: Diagnosis not present

## 2021-02-23 DIAGNOSIS — I482 Chronic atrial fibrillation, unspecified: Secondary | ICD-10-CM | POA: Diagnosis not present

## 2021-03-02 DIAGNOSIS — E1122 Type 2 diabetes mellitus with diabetic chronic kidney disease: Secondary | ICD-10-CM | POA: Diagnosis not present

## 2021-03-02 DIAGNOSIS — N183 Chronic kidney disease, stage 3 unspecified: Secondary | ICD-10-CM | POA: Diagnosis not present

## 2021-03-02 DIAGNOSIS — E1151 Type 2 diabetes mellitus with diabetic peripheral angiopathy without gangrene: Secondary | ICD-10-CM | POA: Diagnosis not present

## 2021-03-02 DIAGNOSIS — E1142 Type 2 diabetes mellitus with diabetic polyneuropathy: Secondary | ICD-10-CM | POA: Diagnosis not present

## 2021-03-02 DIAGNOSIS — I129 Hypertensive chronic kidney disease with stage 1 through stage 4 chronic kidney disease, or unspecified chronic kidney disease: Secondary | ICD-10-CM | POA: Diagnosis not present

## 2021-03-02 DIAGNOSIS — L97829 Non-pressure chronic ulcer of other part of left lower leg with unspecified severity: Secondary | ICD-10-CM | POA: Diagnosis not present

## 2021-03-02 DIAGNOSIS — I87312 Chronic venous hypertension (idiopathic) with ulcer of left lower extremity: Secondary | ICD-10-CM | POA: Diagnosis not present

## 2021-03-02 DIAGNOSIS — I87301 Chronic venous hypertension (idiopathic) without complications of right lower extremity: Secondary | ICD-10-CM | POA: Diagnosis not present

## 2021-03-02 DIAGNOSIS — Z7984 Long term (current) use of oral hypoglycemic drugs: Secondary | ICD-10-CM | POA: Diagnosis not present

## 2021-03-02 DIAGNOSIS — I482 Chronic atrial fibrillation, unspecified: Secondary | ICD-10-CM | POA: Diagnosis not present

## 2021-03-11 DIAGNOSIS — L97829 Non-pressure chronic ulcer of other part of left lower leg with unspecified severity: Secondary | ICD-10-CM | POA: Diagnosis not present

## 2021-03-11 DIAGNOSIS — E1151 Type 2 diabetes mellitus with diabetic peripheral angiopathy without gangrene: Secondary | ICD-10-CM | POA: Diagnosis not present

## 2021-03-11 DIAGNOSIS — E1122 Type 2 diabetes mellitus with diabetic chronic kidney disease: Secondary | ICD-10-CM | POA: Diagnosis not present

## 2021-03-11 DIAGNOSIS — Z7984 Long term (current) use of oral hypoglycemic drugs: Secondary | ICD-10-CM | POA: Diagnosis not present

## 2021-03-11 DIAGNOSIS — I87301 Chronic venous hypertension (idiopathic) without complications of right lower extremity: Secondary | ICD-10-CM | POA: Diagnosis not present

## 2021-03-11 DIAGNOSIS — I129 Hypertensive chronic kidney disease with stage 1 through stage 4 chronic kidney disease, or unspecified chronic kidney disease: Secondary | ICD-10-CM | POA: Diagnosis not present

## 2021-03-11 DIAGNOSIS — N183 Chronic kidney disease, stage 3 unspecified: Secondary | ICD-10-CM | POA: Diagnosis not present

## 2021-03-11 DIAGNOSIS — I482 Chronic atrial fibrillation, unspecified: Secondary | ICD-10-CM | POA: Diagnosis not present

## 2021-03-11 DIAGNOSIS — E1142 Type 2 diabetes mellitus with diabetic polyneuropathy: Secondary | ICD-10-CM | POA: Diagnosis not present

## 2021-03-11 DIAGNOSIS — I87312 Chronic venous hypertension (idiopathic) with ulcer of left lower extremity: Secondary | ICD-10-CM | POA: Diagnosis not present

## 2021-03-13 DIAGNOSIS — E7849 Other hyperlipidemia: Secondary | ICD-10-CM | POA: Diagnosis not present

## 2021-03-13 DIAGNOSIS — N183 Chronic kidney disease, stage 3 unspecified: Secondary | ICD-10-CM | POA: Diagnosis not present

## 2021-03-13 DIAGNOSIS — E039 Hypothyroidism, unspecified: Secondary | ICD-10-CM | POA: Diagnosis not present

## 2021-03-13 DIAGNOSIS — E782 Mixed hyperlipidemia: Secondary | ICD-10-CM | POA: Diagnosis not present

## 2021-03-13 DIAGNOSIS — E1165 Type 2 diabetes mellitus with hyperglycemia: Secondary | ICD-10-CM | POA: Diagnosis not present

## 2021-03-16 DIAGNOSIS — I87312 Chronic venous hypertension (idiopathic) with ulcer of left lower extremity: Secondary | ICD-10-CM | POA: Diagnosis not present

## 2021-03-16 DIAGNOSIS — I87301 Chronic venous hypertension (idiopathic) without complications of right lower extremity: Secondary | ICD-10-CM | POA: Diagnosis not present

## 2021-03-16 DIAGNOSIS — I129 Hypertensive chronic kidney disease with stage 1 through stage 4 chronic kidney disease, or unspecified chronic kidney disease: Secondary | ICD-10-CM | POA: Diagnosis not present

## 2021-03-16 DIAGNOSIS — L97829 Non-pressure chronic ulcer of other part of left lower leg with unspecified severity: Secondary | ICD-10-CM | POA: Diagnosis not present

## 2021-03-16 DIAGNOSIS — Z7984 Long term (current) use of oral hypoglycemic drugs: Secondary | ICD-10-CM | POA: Diagnosis not present

## 2021-03-16 DIAGNOSIS — E1142 Type 2 diabetes mellitus with diabetic polyneuropathy: Secondary | ICD-10-CM | POA: Diagnosis not present

## 2021-03-16 DIAGNOSIS — N183 Chronic kidney disease, stage 3 unspecified: Secondary | ICD-10-CM | POA: Diagnosis not present

## 2021-03-16 DIAGNOSIS — I482 Chronic atrial fibrillation, unspecified: Secondary | ICD-10-CM | POA: Diagnosis not present

## 2021-03-16 DIAGNOSIS — E1151 Type 2 diabetes mellitus with diabetic peripheral angiopathy without gangrene: Secondary | ICD-10-CM | POA: Diagnosis not present

## 2021-03-16 DIAGNOSIS — E1122 Type 2 diabetes mellitus with diabetic chronic kidney disease: Secondary | ICD-10-CM | POA: Diagnosis not present

## 2021-03-17 DIAGNOSIS — I10 Essential (primary) hypertension: Secondary | ICD-10-CM | POA: Diagnosis not present

## 2021-03-17 DIAGNOSIS — E039 Hypothyroidism, unspecified: Secondary | ICD-10-CM | POA: Diagnosis not present

## 2021-03-17 DIAGNOSIS — E1165 Type 2 diabetes mellitus with hyperglycemia: Secondary | ICD-10-CM | POA: Diagnosis not present

## 2021-03-18 DIAGNOSIS — G629 Polyneuropathy, unspecified: Secondary | ICD-10-CM | POA: Diagnosis not present

## 2021-03-18 DIAGNOSIS — E11622 Type 2 diabetes mellitus with other skin ulcer: Secondary | ICD-10-CM | POA: Diagnosis not present

## 2021-03-18 DIAGNOSIS — L97909 Non-pressure chronic ulcer of unspecified part of unspecified lower leg with unspecified severity: Secondary | ICD-10-CM | POA: Diagnosis not present

## 2021-03-18 DIAGNOSIS — I10 Essential (primary) hypertension: Secondary | ICD-10-CM | POA: Diagnosis not present

## 2021-03-18 DIAGNOSIS — E538 Deficiency of other specified B group vitamins: Secondary | ICD-10-CM | POA: Diagnosis not present

## 2021-03-18 DIAGNOSIS — I8392 Asymptomatic varicose veins of left lower extremity: Secondary | ICD-10-CM | POA: Diagnosis not present

## 2021-03-18 DIAGNOSIS — E1142 Type 2 diabetes mellitus with diabetic polyneuropathy: Secondary | ICD-10-CM | POA: Diagnosis not present

## 2021-03-18 DIAGNOSIS — E7849 Other hyperlipidemia: Secondary | ICD-10-CM | POA: Diagnosis not present

## 2021-03-20 DIAGNOSIS — I129 Hypertensive chronic kidney disease with stage 1 through stage 4 chronic kidney disease, or unspecified chronic kidney disease: Secondary | ICD-10-CM | POA: Diagnosis not present

## 2021-03-20 DIAGNOSIS — N183 Chronic kidney disease, stage 3 unspecified: Secondary | ICD-10-CM | POA: Diagnosis not present

## 2021-03-20 DIAGNOSIS — E1151 Type 2 diabetes mellitus with diabetic peripheral angiopathy without gangrene: Secondary | ICD-10-CM | POA: Diagnosis not present

## 2021-03-20 DIAGNOSIS — I482 Chronic atrial fibrillation, unspecified: Secondary | ICD-10-CM | POA: Diagnosis not present

## 2021-03-20 DIAGNOSIS — L97829 Non-pressure chronic ulcer of other part of left lower leg with unspecified severity: Secondary | ICD-10-CM | POA: Diagnosis not present

## 2021-03-20 DIAGNOSIS — E1122 Type 2 diabetes mellitus with diabetic chronic kidney disease: Secondary | ICD-10-CM | POA: Diagnosis not present

## 2021-03-20 DIAGNOSIS — I87312 Chronic venous hypertension (idiopathic) with ulcer of left lower extremity: Secondary | ICD-10-CM | POA: Diagnosis not present

## 2021-03-20 DIAGNOSIS — I87301 Chronic venous hypertension (idiopathic) without complications of right lower extremity: Secondary | ICD-10-CM | POA: Diagnosis not present

## 2021-03-24 DIAGNOSIS — I87312 Chronic venous hypertension (idiopathic) with ulcer of left lower extremity: Secondary | ICD-10-CM | POA: Diagnosis not present

## 2021-03-24 DIAGNOSIS — L97829 Non-pressure chronic ulcer of other part of left lower leg with unspecified severity: Secondary | ICD-10-CM | POA: Diagnosis not present

## 2021-03-24 DIAGNOSIS — E1142 Type 2 diabetes mellitus with diabetic polyneuropathy: Secondary | ICD-10-CM | POA: Diagnosis not present

## 2021-03-24 DIAGNOSIS — E1151 Type 2 diabetes mellitus with diabetic peripheral angiopathy without gangrene: Secondary | ICD-10-CM | POA: Diagnosis not present

## 2021-03-24 DIAGNOSIS — I87301 Chronic venous hypertension (idiopathic) without complications of right lower extremity: Secondary | ICD-10-CM | POA: Diagnosis not present

## 2021-03-24 DIAGNOSIS — N183 Chronic kidney disease, stage 3 unspecified: Secondary | ICD-10-CM | POA: Diagnosis not present

## 2021-03-24 DIAGNOSIS — E1140 Type 2 diabetes mellitus with diabetic neuropathy, unspecified: Secondary | ICD-10-CM | POA: Diagnosis not present

## 2021-03-24 DIAGNOSIS — I129 Hypertensive chronic kidney disease with stage 1 through stage 4 chronic kidney disease, or unspecified chronic kidney disease: Secondary | ICD-10-CM | POA: Diagnosis not present

## 2021-03-24 DIAGNOSIS — Z7984 Long term (current) use of oral hypoglycemic drugs: Secondary | ICD-10-CM | POA: Diagnosis not present

## 2021-03-24 DIAGNOSIS — E1122 Type 2 diabetes mellitus with diabetic chronic kidney disease: Secondary | ICD-10-CM | POA: Diagnosis not present

## 2021-03-24 DIAGNOSIS — I482 Chronic atrial fibrillation, unspecified: Secondary | ICD-10-CM | POA: Diagnosis not present

## 2021-04-03 DIAGNOSIS — I87301 Chronic venous hypertension (idiopathic) without complications of right lower extremity: Secondary | ICD-10-CM | POA: Diagnosis not present

## 2021-04-03 DIAGNOSIS — I482 Chronic atrial fibrillation, unspecified: Secondary | ICD-10-CM | POA: Diagnosis not present

## 2021-04-03 DIAGNOSIS — E1142 Type 2 diabetes mellitus with diabetic polyneuropathy: Secondary | ICD-10-CM | POA: Diagnosis not present

## 2021-04-03 DIAGNOSIS — E1151 Type 2 diabetes mellitus with diabetic peripheral angiopathy without gangrene: Secondary | ICD-10-CM | POA: Diagnosis not present

## 2021-04-03 DIAGNOSIS — I129 Hypertensive chronic kidney disease with stage 1 through stage 4 chronic kidney disease, or unspecified chronic kidney disease: Secondary | ICD-10-CM | POA: Diagnosis not present

## 2021-04-03 DIAGNOSIS — L97829 Non-pressure chronic ulcer of other part of left lower leg with unspecified severity: Secondary | ICD-10-CM | POA: Diagnosis not present

## 2021-04-03 DIAGNOSIS — E1122 Type 2 diabetes mellitus with diabetic chronic kidney disease: Secondary | ICD-10-CM | POA: Diagnosis not present

## 2021-04-03 DIAGNOSIS — I87312 Chronic venous hypertension (idiopathic) with ulcer of left lower extremity: Secondary | ICD-10-CM | POA: Diagnosis not present

## 2021-04-03 DIAGNOSIS — Z7984 Long term (current) use of oral hypoglycemic drugs: Secondary | ICD-10-CM | POA: Diagnosis not present

## 2021-04-03 DIAGNOSIS — N183 Chronic kidney disease, stage 3 unspecified: Secondary | ICD-10-CM | POA: Diagnosis not present

## 2021-04-10 DIAGNOSIS — E1122 Type 2 diabetes mellitus with diabetic chronic kidney disease: Secondary | ICD-10-CM | POA: Diagnosis not present

## 2021-04-10 DIAGNOSIS — E1151 Type 2 diabetes mellitus with diabetic peripheral angiopathy without gangrene: Secondary | ICD-10-CM | POA: Diagnosis not present

## 2021-04-10 DIAGNOSIS — L97829 Non-pressure chronic ulcer of other part of left lower leg with unspecified severity: Secondary | ICD-10-CM | POA: Diagnosis not present

## 2021-04-10 DIAGNOSIS — N183 Chronic kidney disease, stage 3 unspecified: Secondary | ICD-10-CM | POA: Diagnosis not present

## 2021-04-10 DIAGNOSIS — Z7984 Long term (current) use of oral hypoglycemic drugs: Secondary | ICD-10-CM | POA: Diagnosis not present

## 2021-04-10 DIAGNOSIS — I482 Chronic atrial fibrillation, unspecified: Secondary | ICD-10-CM | POA: Diagnosis not present

## 2021-04-10 DIAGNOSIS — I87301 Chronic venous hypertension (idiopathic) without complications of right lower extremity: Secondary | ICD-10-CM | POA: Diagnosis not present

## 2021-04-10 DIAGNOSIS — I87312 Chronic venous hypertension (idiopathic) with ulcer of left lower extremity: Secondary | ICD-10-CM | POA: Diagnosis not present

## 2021-04-10 DIAGNOSIS — I129 Hypertensive chronic kidney disease with stage 1 through stage 4 chronic kidney disease, or unspecified chronic kidney disease: Secondary | ICD-10-CM | POA: Diagnosis not present

## 2021-04-10 DIAGNOSIS — E1142 Type 2 diabetes mellitus with diabetic polyneuropathy: Secondary | ICD-10-CM | POA: Diagnosis not present

## 2021-04-15 DIAGNOSIS — L97829 Non-pressure chronic ulcer of other part of left lower leg with unspecified severity: Secondary | ICD-10-CM | POA: Diagnosis not present

## 2021-04-15 DIAGNOSIS — E1151 Type 2 diabetes mellitus with diabetic peripheral angiopathy without gangrene: Secondary | ICD-10-CM | POA: Diagnosis not present

## 2021-04-15 DIAGNOSIS — I482 Chronic atrial fibrillation, unspecified: Secondary | ICD-10-CM | POA: Diagnosis not present

## 2021-04-15 DIAGNOSIS — Z7984 Long term (current) use of oral hypoglycemic drugs: Secondary | ICD-10-CM | POA: Diagnosis not present

## 2021-04-15 DIAGNOSIS — I87312 Chronic venous hypertension (idiopathic) with ulcer of left lower extremity: Secondary | ICD-10-CM | POA: Diagnosis not present

## 2021-04-15 DIAGNOSIS — I87301 Chronic venous hypertension (idiopathic) without complications of right lower extremity: Secondary | ICD-10-CM | POA: Diagnosis not present

## 2021-04-15 DIAGNOSIS — N183 Chronic kidney disease, stage 3 unspecified: Secondary | ICD-10-CM | POA: Diagnosis not present

## 2021-04-15 DIAGNOSIS — E1142 Type 2 diabetes mellitus with diabetic polyneuropathy: Secondary | ICD-10-CM | POA: Diagnosis not present

## 2021-04-15 DIAGNOSIS — I129 Hypertensive chronic kidney disease with stage 1 through stage 4 chronic kidney disease, or unspecified chronic kidney disease: Secondary | ICD-10-CM | POA: Diagnosis not present

## 2021-04-15 DIAGNOSIS — E1122 Type 2 diabetes mellitus with diabetic chronic kidney disease: Secondary | ICD-10-CM | POA: Diagnosis not present

## 2021-04-23 DIAGNOSIS — I10 Essential (primary) hypertension: Secondary | ICD-10-CM | POA: Diagnosis not present

## 2021-04-23 DIAGNOSIS — L03116 Cellulitis of left lower limb: Secondary | ICD-10-CM | POA: Diagnosis not present

## 2021-06-01 DIAGNOSIS — M79604 Pain in right leg: Secondary | ICD-10-CM | POA: Diagnosis not present

## 2021-06-02 DIAGNOSIS — M79661 Pain in right lower leg: Secondary | ICD-10-CM | POA: Diagnosis not present

## 2021-06-02 DIAGNOSIS — M79604 Pain in right leg: Secondary | ICD-10-CM | POA: Diagnosis not present

## 2021-06-17 DIAGNOSIS — N183 Chronic kidney disease, stage 3 unspecified: Secondary | ICD-10-CM | POA: Diagnosis not present

## 2021-06-17 DIAGNOSIS — I10 Essential (primary) hypertension: Secondary | ICD-10-CM | POA: Diagnosis not present

## 2021-06-17 DIAGNOSIS — E039 Hypothyroidism, unspecified: Secondary | ICD-10-CM | POA: Diagnosis not present

## 2021-06-17 DIAGNOSIS — K2100 Gastro-esophageal reflux disease with esophagitis, without bleeding: Secondary | ICD-10-CM | POA: Diagnosis not present

## 2021-06-17 DIAGNOSIS — E1165 Type 2 diabetes mellitus with hyperglycemia: Secondary | ICD-10-CM | POA: Diagnosis not present

## 2021-06-29 DIAGNOSIS — E1151 Type 2 diabetes mellitus with diabetic peripheral angiopathy without gangrene: Secondary | ICD-10-CM | POA: Diagnosis not present

## 2021-06-29 DIAGNOSIS — E1140 Type 2 diabetes mellitus with diabetic neuropathy, unspecified: Secondary | ICD-10-CM | POA: Diagnosis not present

## 2021-08-11 DIAGNOSIS — D649 Anemia, unspecified: Secondary | ICD-10-CM | POA: Diagnosis not present

## 2021-08-11 DIAGNOSIS — E782 Mixed hyperlipidemia: Secondary | ICD-10-CM | POA: Diagnosis not present

## 2021-08-11 DIAGNOSIS — E1165 Type 2 diabetes mellitus with hyperglycemia: Secondary | ICD-10-CM | POA: Diagnosis not present

## 2021-08-11 DIAGNOSIS — N183 Chronic kidney disease, stage 3 unspecified: Secondary | ICD-10-CM | POA: Diagnosis not present

## 2021-08-11 DIAGNOSIS — E039 Hypothyroidism, unspecified: Secondary | ICD-10-CM | POA: Diagnosis not present

## 2021-08-11 DIAGNOSIS — K2100 Gastro-esophageal reflux disease with esophagitis, without bleeding: Secondary | ICD-10-CM | POA: Diagnosis not present

## 2021-08-11 DIAGNOSIS — E7849 Other hyperlipidemia: Secondary | ICD-10-CM | POA: Diagnosis not present

## 2021-08-11 DIAGNOSIS — I10 Essential (primary) hypertension: Secondary | ICD-10-CM | POA: Diagnosis not present

## 2021-08-14 DIAGNOSIS — E538 Deficiency of other specified B group vitamins: Secondary | ICD-10-CM | POA: Diagnosis not present

## 2021-08-14 DIAGNOSIS — E7849 Other hyperlipidemia: Secondary | ICD-10-CM | POA: Diagnosis not present

## 2021-08-14 DIAGNOSIS — I8392 Asymptomatic varicose veins of left lower extremity: Secondary | ICD-10-CM | POA: Diagnosis not present

## 2021-08-14 DIAGNOSIS — L97909 Non-pressure chronic ulcer of unspecified part of unspecified lower leg with unspecified severity: Secondary | ICD-10-CM | POA: Diagnosis not present

## 2021-08-14 DIAGNOSIS — I10 Essential (primary) hypertension: Secondary | ICD-10-CM | POA: Diagnosis not present

## 2021-08-14 DIAGNOSIS — Z1389 Encounter for screening for other disorder: Secondary | ICD-10-CM | POA: Diagnosis not present

## 2021-08-14 DIAGNOSIS — E11622 Type 2 diabetes mellitus with other skin ulcer: Secondary | ICD-10-CM | POA: Diagnosis not present

## 2021-08-14 DIAGNOSIS — G629 Polyneuropathy, unspecified: Secondary | ICD-10-CM | POA: Diagnosis not present

## 2021-08-14 DIAGNOSIS — K2100 Gastro-esophageal reflux disease with esophagitis, without bleeding: Secondary | ICD-10-CM | POA: Diagnosis not present

## 2021-08-14 DIAGNOSIS — E1142 Type 2 diabetes mellitus with diabetic polyneuropathy: Secondary | ICD-10-CM | POA: Diagnosis not present

## 2021-08-14 DIAGNOSIS — Z0001 Encounter for general adult medical examination with abnormal findings: Secondary | ICD-10-CM | POA: Diagnosis not present

## 2021-08-27 DIAGNOSIS — H35033 Hypertensive retinopathy, bilateral: Secondary | ICD-10-CM | POA: Diagnosis not present

## 2021-09-07 DIAGNOSIS — E1151 Type 2 diabetes mellitus with diabetic peripheral angiopathy without gangrene: Secondary | ICD-10-CM | POA: Diagnosis not present

## 2021-09-07 DIAGNOSIS — E1140 Type 2 diabetes mellitus with diabetic neuropathy, unspecified: Secondary | ICD-10-CM | POA: Diagnosis not present

## 2021-09-17 DIAGNOSIS — E782 Mixed hyperlipidemia: Secondary | ICD-10-CM | POA: Diagnosis not present

## 2021-09-17 DIAGNOSIS — E1142 Type 2 diabetes mellitus with diabetic polyneuropathy: Secondary | ICD-10-CM | POA: Diagnosis not present

## 2021-09-22 DIAGNOSIS — L89892 Pressure ulcer of other site, stage 2: Secondary | ICD-10-CM | POA: Diagnosis not present

## 2021-09-22 DIAGNOSIS — M79671 Pain in right foot: Secondary | ICD-10-CM | POA: Diagnosis not present

## 2021-09-22 DIAGNOSIS — E1140 Type 2 diabetes mellitus with diabetic neuropathy, unspecified: Secondary | ICD-10-CM | POA: Diagnosis not present

## 2021-09-22 DIAGNOSIS — E1151 Type 2 diabetes mellitus with diabetic peripheral angiopathy without gangrene: Secondary | ICD-10-CM | POA: Diagnosis not present

## 2021-09-22 DIAGNOSIS — M79674 Pain in right toe(s): Secondary | ICD-10-CM | POA: Diagnosis not present

## 2021-09-29 DIAGNOSIS — Z23 Encounter for immunization: Secondary | ICD-10-CM | POA: Diagnosis not present

## 2021-10-12 DIAGNOSIS — E1151 Type 2 diabetes mellitus with diabetic peripheral angiopathy without gangrene: Secondary | ICD-10-CM | POA: Diagnosis not present

## 2021-10-12 DIAGNOSIS — M79671 Pain in right foot: Secondary | ICD-10-CM | POA: Diagnosis not present

## 2021-10-12 DIAGNOSIS — M79674 Pain in right toe(s): Secondary | ICD-10-CM | POA: Diagnosis not present

## 2021-10-12 DIAGNOSIS — L89892 Pressure ulcer of other site, stage 2: Secondary | ICD-10-CM | POA: Diagnosis not present

## 2021-10-12 DIAGNOSIS — E1140 Type 2 diabetes mellitus with diabetic neuropathy, unspecified: Secondary | ICD-10-CM | POA: Diagnosis not present

## 2021-10-14 DIAGNOSIS — J329 Chronic sinusitis, unspecified: Secondary | ICD-10-CM | POA: Diagnosis not present

## 2021-10-14 DIAGNOSIS — L309 Dermatitis, unspecified: Secondary | ICD-10-CM | POA: Diagnosis not present

## 2021-11-02 DIAGNOSIS — M79674 Pain in right toe(s): Secondary | ICD-10-CM | POA: Diagnosis not present

## 2021-11-02 DIAGNOSIS — E1140 Type 2 diabetes mellitus with diabetic neuropathy, unspecified: Secondary | ICD-10-CM | POA: Diagnosis not present

## 2021-11-02 DIAGNOSIS — E1151 Type 2 diabetes mellitus with diabetic peripheral angiopathy without gangrene: Secondary | ICD-10-CM | POA: Diagnosis not present

## 2021-11-02 DIAGNOSIS — L89892 Pressure ulcer of other site, stage 2: Secondary | ICD-10-CM | POA: Diagnosis not present

## 2021-11-02 DIAGNOSIS — M79671 Pain in right foot: Secondary | ICD-10-CM | POA: Diagnosis not present

## 2021-11-17 DIAGNOSIS — E1142 Type 2 diabetes mellitus with diabetic polyneuropathy: Secondary | ICD-10-CM | POA: Diagnosis not present

## 2021-11-17 DIAGNOSIS — E782 Mixed hyperlipidemia: Secondary | ICD-10-CM | POA: Diagnosis not present

## 2021-11-17 DIAGNOSIS — E1140 Type 2 diabetes mellitus with diabetic neuropathy, unspecified: Secondary | ICD-10-CM | POA: Diagnosis not present

## 2021-11-17 DIAGNOSIS — E1151 Type 2 diabetes mellitus with diabetic peripheral angiopathy without gangrene: Secondary | ICD-10-CM | POA: Diagnosis not present

## 2021-12-04 DIAGNOSIS — K219 Gastro-esophageal reflux disease without esophagitis: Secondary | ICD-10-CM | POA: Diagnosis not present

## 2021-12-04 DIAGNOSIS — E1142 Type 2 diabetes mellitus with diabetic polyneuropathy: Secondary | ICD-10-CM | POA: Diagnosis not present

## 2021-12-04 DIAGNOSIS — E7849 Other hyperlipidemia: Secondary | ICD-10-CM | POA: Diagnosis not present

## 2021-12-04 DIAGNOSIS — I10 Essential (primary) hypertension: Secondary | ICD-10-CM | POA: Diagnosis not present

## 2021-12-12 DIAGNOSIS — K2100 Gastro-esophageal reflux disease with esophagitis, without bleeding: Secondary | ICD-10-CM | POA: Diagnosis not present

## 2021-12-12 DIAGNOSIS — G629 Polyneuropathy, unspecified: Secondary | ICD-10-CM | POA: Diagnosis not present

## 2021-12-12 DIAGNOSIS — J301 Allergic rhinitis due to pollen: Secondary | ICD-10-CM | POA: Diagnosis not present

## 2021-12-12 DIAGNOSIS — I10 Essential (primary) hypertension: Secondary | ICD-10-CM | POA: Diagnosis not present

## 2021-12-12 DIAGNOSIS — E1142 Type 2 diabetes mellitus with diabetic polyneuropathy: Secondary | ICD-10-CM | POA: Diagnosis not present

## 2021-12-12 DIAGNOSIS — E538 Deficiency of other specified B group vitamins: Secondary | ICD-10-CM | POA: Diagnosis not present

## 2021-12-12 DIAGNOSIS — L97909 Non-pressure chronic ulcer of unspecified part of unspecified lower leg with unspecified severity: Secondary | ICD-10-CM | POA: Diagnosis not present

## 2021-12-12 DIAGNOSIS — E7849 Other hyperlipidemia: Secondary | ICD-10-CM | POA: Diagnosis not present

## 2021-12-12 DIAGNOSIS — E11622 Type 2 diabetes mellitus with other skin ulcer: Secondary | ICD-10-CM | POA: Diagnosis not present

## 2021-12-12 DIAGNOSIS — I8392 Asymptomatic varicose veins of left lower extremity: Secondary | ICD-10-CM | POA: Diagnosis not present

## 2021-12-22 DIAGNOSIS — Z1231 Encounter for screening mammogram for malignant neoplasm of breast: Secondary | ICD-10-CM | POA: Diagnosis not present

## 2022-02-02 DIAGNOSIS — E1140 Type 2 diabetes mellitus with diabetic neuropathy, unspecified: Secondary | ICD-10-CM | POA: Diagnosis not present

## 2022-02-02 DIAGNOSIS — E1151 Type 2 diabetes mellitus with diabetic peripheral angiopathy without gangrene: Secondary | ICD-10-CM | POA: Diagnosis not present

## 2022-02-09 DIAGNOSIS — M8589 Other specified disorders of bone density and structure, multiple sites: Secondary | ICD-10-CM | POA: Diagnosis not present

## 2022-02-09 DIAGNOSIS — M810 Age-related osteoporosis without current pathological fracture: Secondary | ICD-10-CM | POA: Diagnosis not present

## 2022-03-02 DIAGNOSIS — E113293 Type 2 diabetes mellitus with mild nonproliferative diabetic retinopathy without macular edema, bilateral: Secondary | ICD-10-CM | POA: Diagnosis not present

## 2022-03-02 DIAGNOSIS — H524 Presbyopia: Secondary | ICD-10-CM | POA: Diagnosis not present

## 2022-03-30 DIAGNOSIS — E1142 Type 2 diabetes mellitus with diabetic polyneuropathy: Secondary | ICD-10-CM | POA: Diagnosis not present

## 2022-03-30 DIAGNOSIS — E1122 Type 2 diabetes mellitus with diabetic chronic kidney disease: Secondary | ICD-10-CM | POA: Diagnosis not present

## 2022-03-30 DIAGNOSIS — E7849 Other hyperlipidemia: Secondary | ICD-10-CM | POA: Diagnosis not present

## 2022-03-30 DIAGNOSIS — E1165 Type 2 diabetes mellitus with hyperglycemia: Secondary | ICD-10-CM | POA: Diagnosis not present

## 2022-03-30 DIAGNOSIS — Z0001 Encounter for general adult medical examination with abnormal findings: Secondary | ICD-10-CM | POA: Diagnosis not present

## 2022-03-30 DIAGNOSIS — I10 Essential (primary) hypertension: Secondary | ICD-10-CM | POA: Diagnosis not present

## 2022-04-06 DIAGNOSIS — E1142 Type 2 diabetes mellitus with diabetic polyneuropathy: Secondary | ICD-10-CM | POA: Diagnosis not present

## 2022-04-06 DIAGNOSIS — E11622 Type 2 diabetes mellitus with other skin ulcer: Secondary | ICD-10-CM | POA: Diagnosis not present

## 2022-04-06 DIAGNOSIS — E7849 Other hyperlipidemia: Secondary | ICD-10-CM | POA: Diagnosis not present

## 2022-04-06 DIAGNOSIS — G629 Polyneuropathy, unspecified: Secondary | ICD-10-CM | POA: Diagnosis not present

## 2022-04-06 DIAGNOSIS — I10 Essential (primary) hypertension: Secondary | ICD-10-CM | POA: Diagnosis not present

## 2022-04-06 DIAGNOSIS — E538 Deficiency of other specified B group vitamins: Secondary | ICD-10-CM | POA: Diagnosis not present

## 2022-04-06 DIAGNOSIS — L97909 Non-pressure chronic ulcer of unspecified part of unspecified lower leg with unspecified severity: Secondary | ICD-10-CM | POA: Diagnosis not present

## 2022-04-06 DIAGNOSIS — J301 Allergic rhinitis due to pollen: Secondary | ICD-10-CM | POA: Diagnosis not present

## 2022-04-06 DIAGNOSIS — I8392 Asymptomatic varicose veins of left lower extremity: Secondary | ICD-10-CM | POA: Diagnosis not present

## 2022-04-06 DIAGNOSIS — K2100 Gastro-esophageal reflux disease with esophagitis, without bleeding: Secondary | ICD-10-CM | POA: Diagnosis not present

## 2022-04-13 DIAGNOSIS — E1140 Type 2 diabetes mellitus with diabetic neuropathy, unspecified: Secondary | ICD-10-CM | POA: Diagnosis not present

## 2022-04-13 DIAGNOSIS — E1151 Type 2 diabetes mellitus with diabetic peripheral angiopathy without gangrene: Secondary | ICD-10-CM | POA: Diagnosis not present

## 2022-06-24 DIAGNOSIS — E1151 Type 2 diabetes mellitus with diabetic peripheral angiopathy without gangrene: Secondary | ICD-10-CM | POA: Diagnosis not present

## 2022-06-24 DIAGNOSIS — E1140 Type 2 diabetes mellitus with diabetic neuropathy, unspecified: Secondary | ICD-10-CM | POA: Diagnosis not present

## 2022-07-18 DIAGNOSIS — I10 Essential (primary) hypertension: Secondary | ICD-10-CM | POA: Diagnosis not present

## 2022-07-18 DIAGNOSIS — E1122 Type 2 diabetes mellitus with diabetic chronic kidney disease: Secondary | ICD-10-CM | POA: Diagnosis not present

## 2022-07-19 DIAGNOSIS — J019 Acute sinusitis, unspecified: Secondary | ICD-10-CM | POA: Diagnosis not present

## 2022-08-19 DIAGNOSIS — E7849 Other hyperlipidemia: Secondary | ICD-10-CM | POA: Diagnosis not present

## 2022-08-19 DIAGNOSIS — E782 Mixed hyperlipidemia: Secondary | ICD-10-CM | POA: Diagnosis not present

## 2022-08-19 DIAGNOSIS — N183 Chronic kidney disease, stage 3 unspecified: Secondary | ICD-10-CM | POA: Diagnosis not present

## 2022-08-19 DIAGNOSIS — E559 Vitamin D deficiency, unspecified: Secondary | ICD-10-CM | POA: Diagnosis not present

## 2022-08-19 DIAGNOSIS — E1122 Type 2 diabetes mellitus with diabetic chronic kidney disease: Secondary | ICD-10-CM | POA: Diagnosis not present

## 2022-08-19 DIAGNOSIS — E039 Hypothyroidism, unspecified: Secondary | ICD-10-CM | POA: Diagnosis not present

## 2022-08-19 DIAGNOSIS — E1165 Type 2 diabetes mellitus with hyperglycemia: Secondary | ICD-10-CM | POA: Diagnosis not present

## 2022-08-19 DIAGNOSIS — K219 Gastro-esophageal reflux disease without esophagitis: Secondary | ICD-10-CM | POA: Diagnosis not present

## 2022-08-19 DIAGNOSIS — I10 Essential (primary) hypertension: Secondary | ICD-10-CM | POA: Diagnosis not present

## 2022-08-23 DIAGNOSIS — I10 Essential (primary) hypertension: Secondary | ICD-10-CM | POA: Diagnosis not present

## 2022-08-23 DIAGNOSIS — E538 Deficiency of other specified B group vitamins: Secondary | ICD-10-CM | POA: Diagnosis not present

## 2022-08-23 DIAGNOSIS — E7849 Other hyperlipidemia: Secondary | ICD-10-CM | POA: Diagnosis not present

## 2022-08-23 DIAGNOSIS — Z0001 Encounter for general adult medical examination with abnormal findings: Secondary | ICD-10-CM | POA: Diagnosis not present

## 2022-08-23 DIAGNOSIS — L97909 Non-pressure chronic ulcer of unspecified part of unspecified lower leg with unspecified severity: Secondary | ICD-10-CM | POA: Diagnosis not present

## 2022-08-23 DIAGNOSIS — I8392 Asymptomatic varicose veins of left lower extremity: Secondary | ICD-10-CM | POA: Diagnosis not present

## 2022-08-23 DIAGNOSIS — G629 Polyneuropathy, unspecified: Secondary | ICD-10-CM | POA: Diagnosis not present

## 2022-08-23 DIAGNOSIS — Z23 Encounter for immunization: Secondary | ICD-10-CM | POA: Diagnosis not present

## 2022-08-23 DIAGNOSIS — E11622 Type 2 diabetes mellitus with other skin ulcer: Secondary | ICD-10-CM | POA: Diagnosis not present

## 2022-08-23 DIAGNOSIS — Z1389 Encounter for screening for other disorder: Secondary | ICD-10-CM | POA: Diagnosis not present

## 2022-08-23 DIAGNOSIS — E1142 Type 2 diabetes mellitus with diabetic polyneuropathy: Secondary | ICD-10-CM | POA: Diagnosis not present

## 2022-09-01 DIAGNOSIS — D649 Anemia, unspecified: Secondary | ICD-10-CM | POA: Diagnosis not present

## 2022-09-01 DIAGNOSIS — E539 Vitamin B deficiency, unspecified: Secondary | ICD-10-CM | POA: Diagnosis not present

## 2022-09-08 DIAGNOSIS — E1140 Type 2 diabetes mellitus with diabetic neuropathy, unspecified: Secondary | ICD-10-CM | POA: Diagnosis not present

## 2022-09-08 DIAGNOSIS — E1151 Type 2 diabetes mellitus with diabetic peripheral angiopathy without gangrene: Secondary | ICD-10-CM | POA: Diagnosis not present

## 2022-09-14 DIAGNOSIS — H35033 Hypertensive retinopathy, bilateral: Secondary | ICD-10-CM | POA: Diagnosis not present

## 2022-09-30 DIAGNOSIS — Z23 Encounter for immunization: Secondary | ICD-10-CM | POA: Diagnosis not present

## 2022-10-07 DIAGNOSIS — Z23 Encounter for immunization: Secondary | ICD-10-CM | POA: Diagnosis not present

## 2022-10-25 DIAGNOSIS — J209 Acute bronchitis, unspecified: Secondary | ICD-10-CM | POA: Diagnosis not present

## 2022-10-25 DIAGNOSIS — J0101 Acute recurrent maxillary sinusitis: Secondary | ICD-10-CM | POA: Diagnosis not present

## 2022-11-16 DIAGNOSIS — E1151 Type 2 diabetes mellitus with diabetic peripheral angiopathy without gangrene: Secondary | ICD-10-CM | POA: Diagnosis not present

## 2022-11-16 DIAGNOSIS — E1140 Type 2 diabetes mellitus with diabetic neuropathy, unspecified: Secondary | ICD-10-CM | POA: Diagnosis not present

## 2022-11-18 DIAGNOSIS — E782 Mixed hyperlipidemia: Secondary | ICD-10-CM | POA: Diagnosis not present

## 2022-11-18 DIAGNOSIS — I10 Essential (primary) hypertension: Secondary | ICD-10-CM | POA: Diagnosis not present

## 2022-11-18 DIAGNOSIS — E1122 Type 2 diabetes mellitus with diabetic chronic kidney disease: Secondary | ICD-10-CM | POA: Diagnosis not present

## 2022-12-06 IMAGING — MG MM DIGITAL SCREENING BILAT W/ TOMO AND CAD
6 of 12 series · 6 of 36 positions shown · non-contrast
Comparison: Previous exam(s).

CLINICAL DATA: Screening.

EXAM:
DIGITAL SCREENING BILATERAL MAMMOGRAM WITH TOMOSYNTHESIS AND CAD
TECHNIQUE: Bilateral screening digital craniocaudal and mediolateral oblique
mammograms were obtained. Bilateral screening digital breast
tomosynthesis was performed. The images were evaluated with
computer-aided detection.

[L MLO synth-2D (1 of 2)]
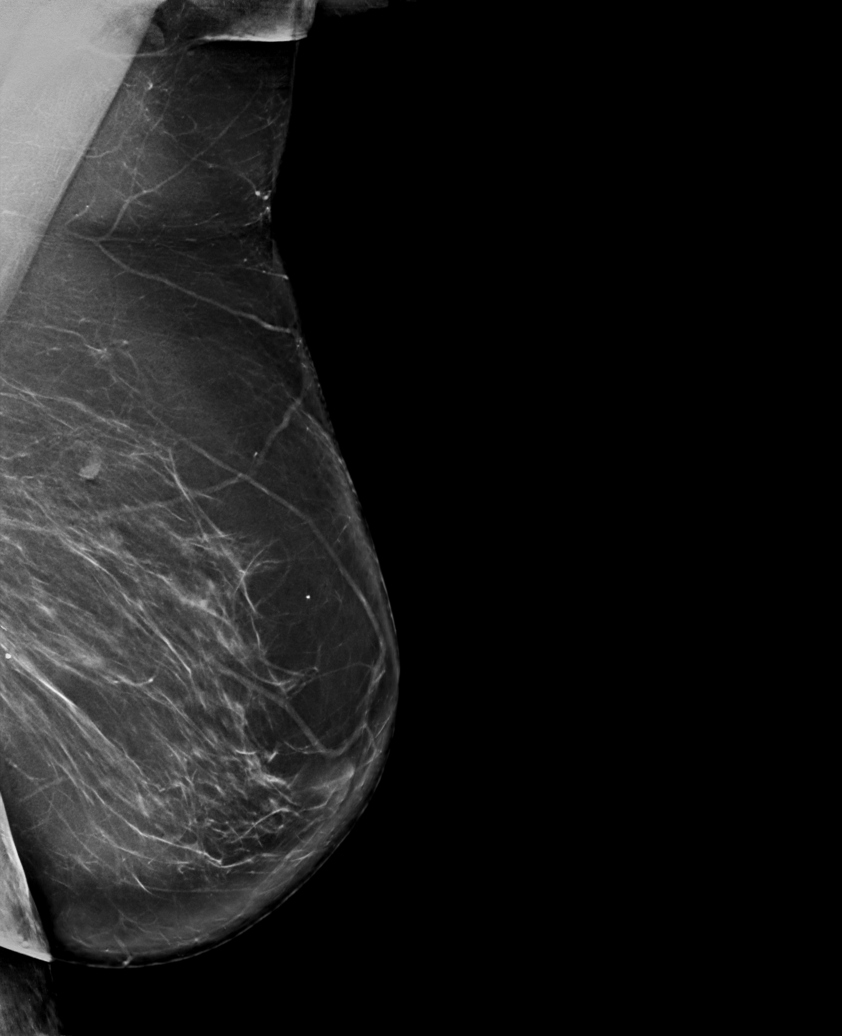

[R MLO synth-2D (1 of 2)]
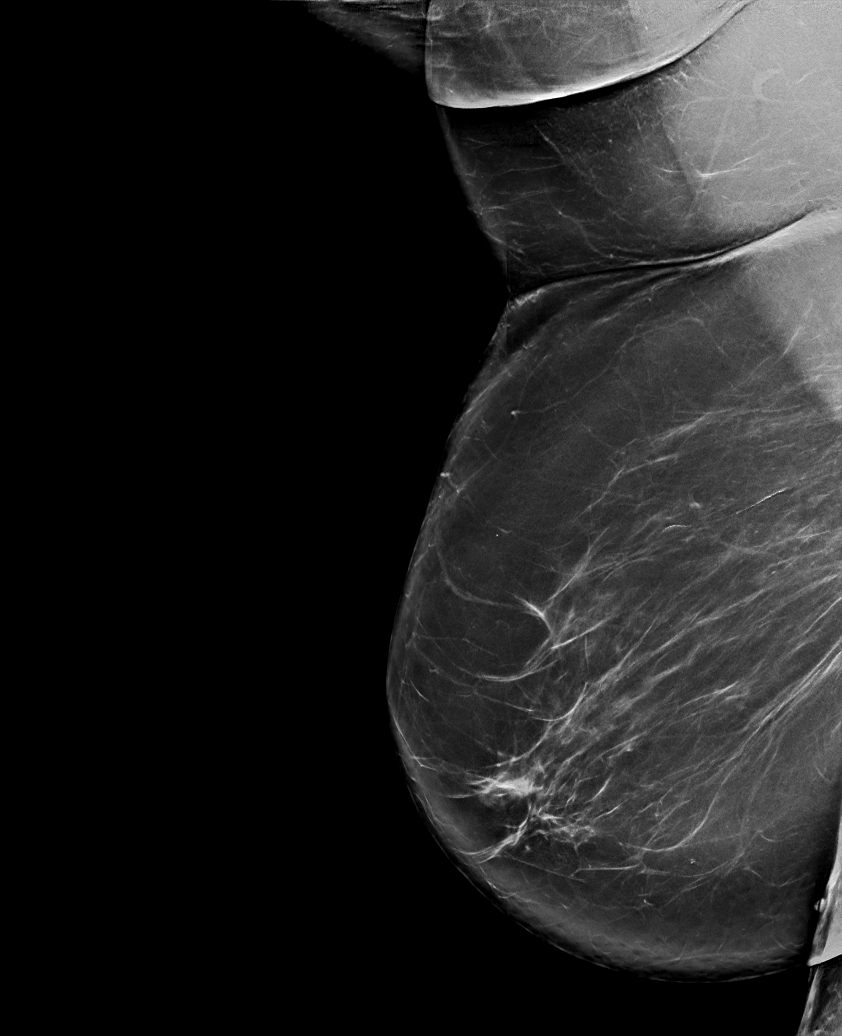

[R MLO synth-2D (2 of 2)]
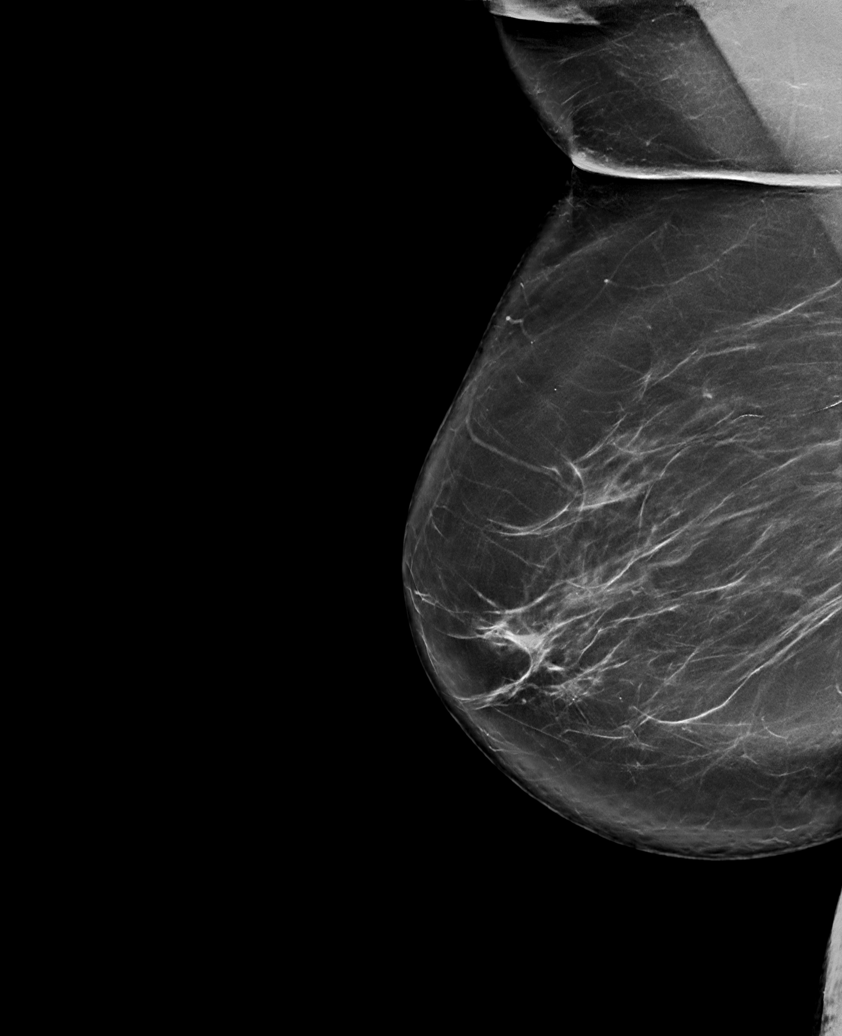

[L MLO synth-2D (2 of 2)]
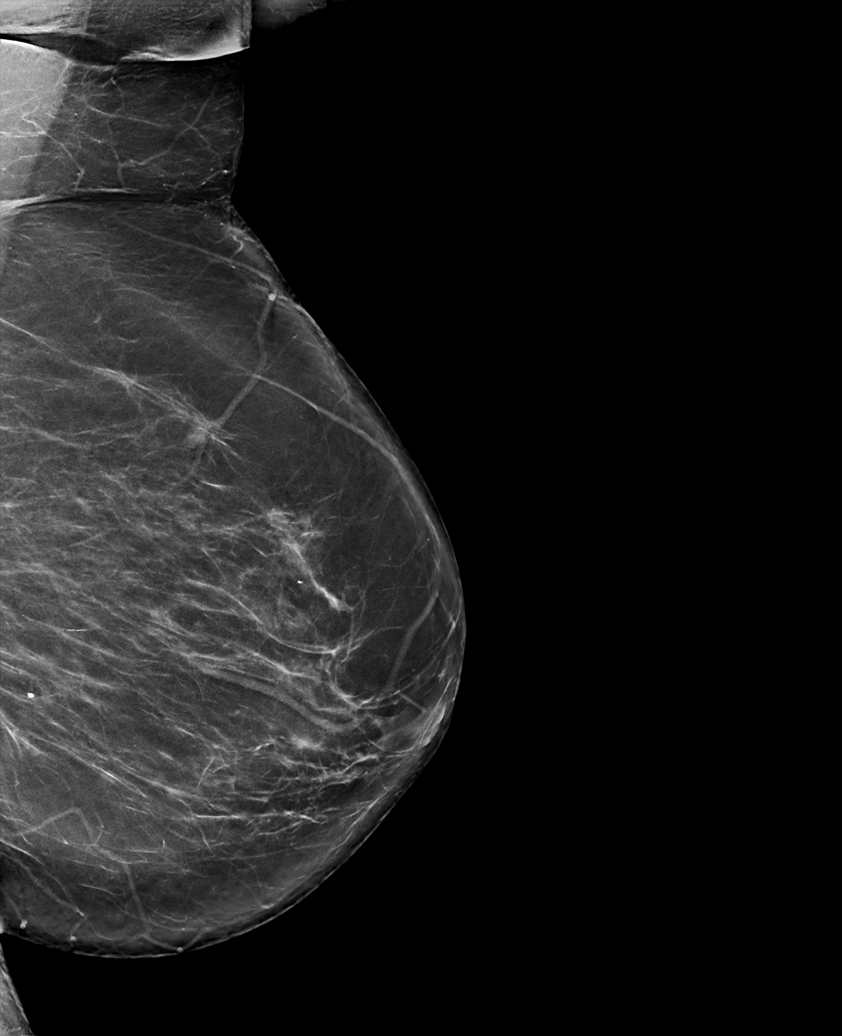

[L CC synth-2D]
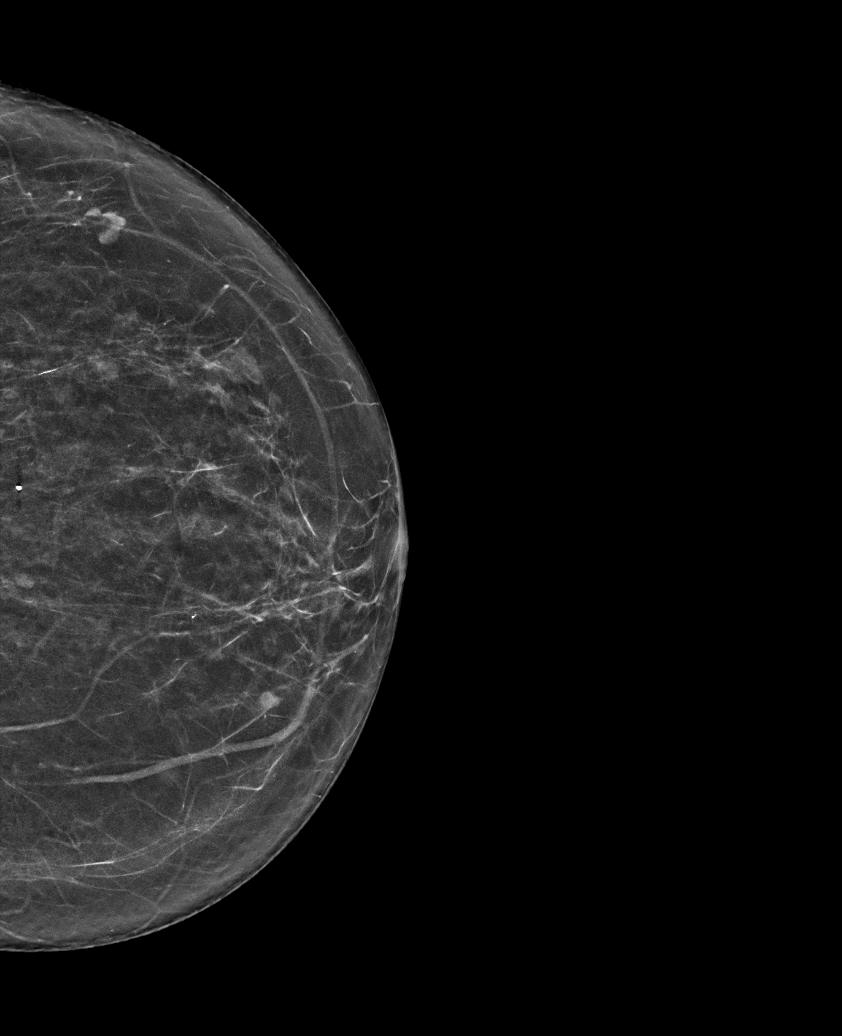

[R CC synth-2D]
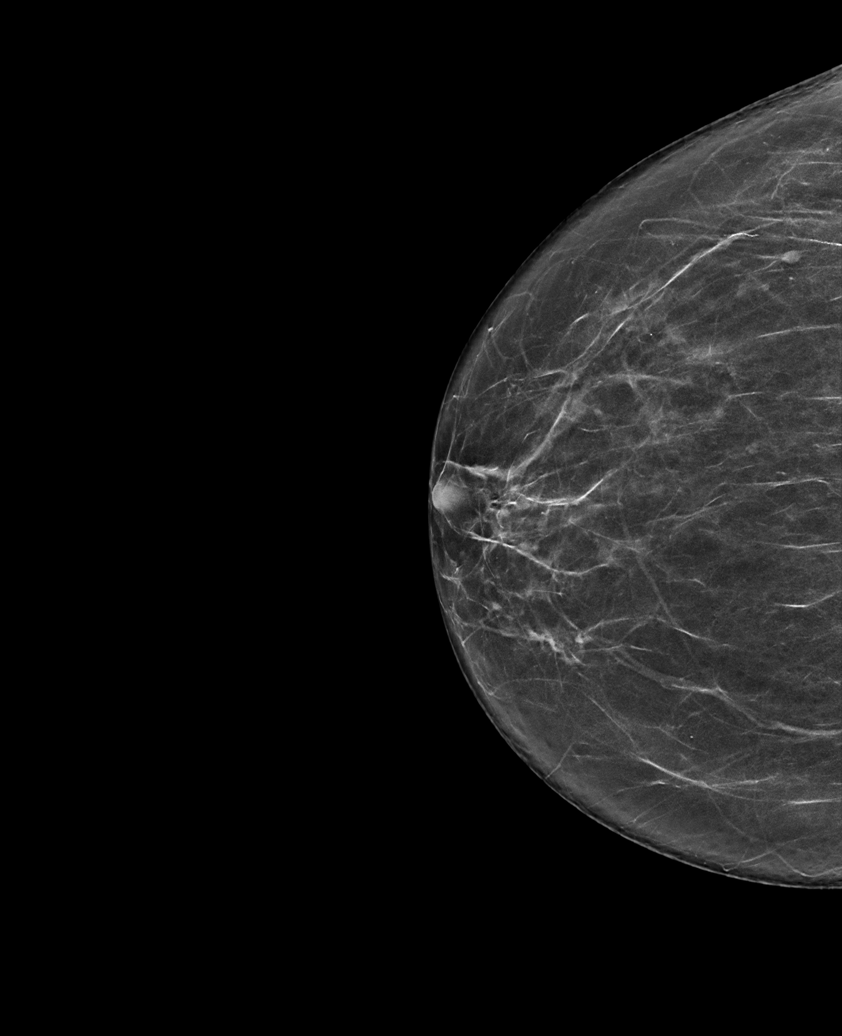

[6 of 36 positions shown; findings below may reference images not displayed]

ACR Breast Density Category b: There are scattered areas of
fibroglandular density.
FINDINGS: There are no findings suspicious for malignancy.
IMPRESSION: No mammographic evidence of malignancy. A result letter of this
screening mammogram will be mailed directly to the patient.

RECOMMENDATION:
Screening mammogram in one year. (Code:51-O-LD2)

BI-RADS CATEGORY  1: Negative.

## 2022-12-23 DIAGNOSIS — E1142 Type 2 diabetes mellitus with diabetic polyneuropathy: Secondary | ICD-10-CM | POA: Diagnosis not present

## 2022-12-23 DIAGNOSIS — E1165 Type 2 diabetes mellitus with hyperglycemia: Secondary | ICD-10-CM | POA: Diagnosis not present

## 2022-12-23 DIAGNOSIS — E7849 Other hyperlipidemia: Secondary | ICD-10-CM | POA: Diagnosis not present

## 2022-12-23 DIAGNOSIS — E1122 Type 2 diabetes mellitus with diabetic chronic kidney disease: Secondary | ICD-10-CM | POA: Diagnosis not present

## 2022-12-23 DIAGNOSIS — E539 Vitamin B deficiency, unspecified: Secondary | ICD-10-CM | POA: Diagnosis not present

## 2022-12-23 DIAGNOSIS — I10 Essential (primary) hypertension: Secondary | ICD-10-CM | POA: Diagnosis not present

## 2022-12-27 DIAGNOSIS — Z1231 Encounter for screening mammogram for malignant neoplasm of breast: Secondary | ICD-10-CM | POA: Diagnosis not present

## 2022-12-28 DIAGNOSIS — M25532 Pain in left wrist: Secondary | ICD-10-CM | POA: Diagnosis not present

## 2022-12-28 DIAGNOSIS — G629 Polyneuropathy, unspecified: Secondary | ICD-10-CM | POA: Diagnosis not present

## 2022-12-28 DIAGNOSIS — E538 Deficiency of other specified B group vitamins: Secondary | ICD-10-CM | POA: Diagnosis not present

## 2022-12-28 DIAGNOSIS — E7849 Other hyperlipidemia: Secondary | ICD-10-CM | POA: Diagnosis not present

## 2022-12-28 DIAGNOSIS — K2100 Gastro-esophageal reflux disease with esophagitis, without bleeding: Secondary | ICD-10-CM | POA: Diagnosis not present

## 2022-12-28 DIAGNOSIS — I890 Lymphedema, not elsewhere classified: Secondary | ICD-10-CM | POA: Diagnosis not present

## 2022-12-28 DIAGNOSIS — E1142 Type 2 diabetes mellitus with diabetic polyneuropathy: Secondary | ICD-10-CM | POA: Diagnosis not present

## 2022-12-28 DIAGNOSIS — N1831 Chronic kidney disease, stage 3a: Secondary | ICD-10-CM | POA: Diagnosis not present

## 2022-12-28 DIAGNOSIS — I8392 Asymptomatic varicose veins of left lower extremity: Secondary | ICD-10-CM | POA: Diagnosis not present

## 2022-12-28 DIAGNOSIS — E11622 Type 2 diabetes mellitus with other skin ulcer: Secondary | ICD-10-CM | POA: Diagnosis not present

## 2022-12-28 DIAGNOSIS — I10 Essential (primary) hypertension: Secondary | ICD-10-CM | POA: Diagnosis not present

## 2022-12-28 DIAGNOSIS — L97909 Non-pressure chronic ulcer of unspecified part of unspecified lower leg with unspecified severity: Secondary | ICD-10-CM | POA: Diagnosis not present

## 2023-02-01 DIAGNOSIS — E1140 Type 2 diabetes mellitus with diabetic neuropathy, unspecified: Secondary | ICD-10-CM | POA: Diagnosis not present

## 2023-02-01 DIAGNOSIS — E1151 Type 2 diabetes mellitus with diabetic peripheral angiopathy without gangrene: Secondary | ICD-10-CM | POA: Diagnosis not present

## 2023-02-09 DIAGNOSIS — L03811 Cellulitis of head [any part, except face]: Secondary | ICD-10-CM | POA: Diagnosis not present

## 2023-02-18 DIAGNOSIS — E1122 Type 2 diabetes mellitus with diabetic chronic kidney disease: Secondary | ICD-10-CM | POA: Diagnosis not present

## 2023-02-18 DIAGNOSIS — I10 Essential (primary) hypertension: Secondary | ICD-10-CM | POA: Diagnosis not present

## 2023-02-18 DIAGNOSIS — D649 Anemia, unspecified: Secondary | ICD-10-CM | POA: Diagnosis not present

## 2023-02-18 DIAGNOSIS — E539 Vitamin B deficiency, unspecified: Secondary | ICD-10-CM | POA: Diagnosis not present

## 2023-02-18 DIAGNOSIS — E782 Mixed hyperlipidemia: Secondary | ICD-10-CM | POA: Diagnosis not present

## 2023-03-03 DIAGNOSIS — H35033 Hypertensive retinopathy, bilateral: Secondary | ICD-10-CM | POA: Diagnosis not present

## 2023-03-03 DIAGNOSIS — H524 Presbyopia: Secondary | ICD-10-CM | POA: Diagnosis not present

## 2023-03-18 DIAGNOSIS — E1122 Type 2 diabetes mellitus with diabetic chronic kidney disease: Secondary | ICD-10-CM | POA: Diagnosis not present

## 2023-03-18 DIAGNOSIS — D649 Anemia, unspecified: Secondary | ICD-10-CM | POA: Diagnosis not present

## 2023-03-18 DIAGNOSIS — E782 Mixed hyperlipidemia: Secondary | ICD-10-CM | POA: Diagnosis not present

## 2023-03-18 DIAGNOSIS — E539 Vitamin B deficiency, unspecified: Secondary | ICD-10-CM | POA: Diagnosis not present

## 2023-03-18 DIAGNOSIS — I10 Essential (primary) hypertension: Secondary | ICD-10-CM | POA: Diagnosis not present

## 2023-04-12 DIAGNOSIS — E1140 Type 2 diabetes mellitus with diabetic neuropathy, unspecified: Secondary | ICD-10-CM | POA: Diagnosis not present

## 2023-04-12 DIAGNOSIS — E1151 Type 2 diabetes mellitus with diabetic peripheral angiopathy without gangrene: Secondary | ICD-10-CM | POA: Diagnosis not present

## 2023-04-18 DIAGNOSIS — E782 Mixed hyperlipidemia: Secondary | ICD-10-CM | POA: Diagnosis not present

## 2023-04-18 DIAGNOSIS — E539 Vitamin B deficiency, unspecified: Secondary | ICD-10-CM | POA: Diagnosis not present

## 2023-04-18 DIAGNOSIS — I10 Essential (primary) hypertension: Secondary | ICD-10-CM | POA: Diagnosis not present

## 2023-04-18 DIAGNOSIS — D649 Anemia, unspecified: Secondary | ICD-10-CM | POA: Diagnosis not present

## 2023-04-18 DIAGNOSIS — E1122 Type 2 diabetes mellitus with diabetic chronic kidney disease: Secondary | ICD-10-CM | POA: Diagnosis not present

## 2023-04-25 DIAGNOSIS — Z1321 Encounter for screening for nutritional disorder: Secondary | ICD-10-CM | POA: Diagnosis not present

## 2023-04-25 DIAGNOSIS — Z13220 Encounter for screening for lipoid disorders: Secondary | ICD-10-CM | POA: Diagnosis not present

## 2023-04-25 DIAGNOSIS — M25532 Pain in left wrist: Secondary | ICD-10-CM | POA: Diagnosis not present

## 2023-04-25 DIAGNOSIS — Z1329 Encounter for screening for other suspected endocrine disorder: Secondary | ICD-10-CM | POA: Diagnosis not present

## 2023-04-25 DIAGNOSIS — E1142 Type 2 diabetes mellitus with diabetic polyneuropathy: Secondary | ICD-10-CM | POA: Diagnosis not present

## 2023-04-25 DIAGNOSIS — N1831 Chronic kidney disease, stage 3a: Secondary | ICD-10-CM | POA: Diagnosis not present

## 2023-04-25 DIAGNOSIS — E1143 Type 2 diabetes mellitus with diabetic autonomic (poly)neuropathy: Secondary | ICD-10-CM | POA: Diagnosis not present

## 2023-04-29 DIAGNOSIS — N1831 Chronic kidney disease, stage 3a: Secondary | ICD-10-CM | POA: Diagnosis not present

## 2023-04-29 DIAGNOSIS — J301 Allergic rhinitis due to pollen: Secondary | ICD-10-CM | POA: Diagnosis not present

## 2023-04-29 DIAGNOSIS — E1142 Type 2 diabetes mellitus with diabetic polyneuropathy: Secondary | ICD-10-CM | POA: Diagnosis not present

## 2023-05-18 DIAGNOSIS — D649 Anemia, unspecified: Secondary | ICD-10-CM | POA: Diagnosis not present

## 2023-05-18 DIAGNOSIS — E539 Vitamin B deficiency, unspecified: Secondary | ICD-10-CM | POA: Diagnosis not present

## 2023-05-18 DIAGNOSIS — E1122 Type 2 diabetes mellitus with diabetic chronic kidney disease: Secondary | ICD-10-CM | POA: Diagnosis not present

## 2023-05-18 DIAGNOSIS — E782 Mixed hyperlipidemia: Secondary | ICD-10-CM | POA: Diagnosis not present

## 2023-05-18 DIAGNOSIS — I10 Essential (primary) hypertension: Secondary | ICD-10-CM | POA: Diagnosis not present

## 2023-06-17 DIAGNOSIS — I10 Essential (primary) hypertension: Secondary | ICD-10-CM | POA: Diagnosis not present

## 2023-06-17 DIAGNOSIS — D649 Anemia, unspecified: Secondary | ICD-10-CM | POA: Diagnosis not present

## 2023-06-17 DIAGNOSIS — E1122 Type 2 diabetes mellitus with diabetic chronic kidney disease: Secondary | ICD-10-CM | POA: Diagnosis not present

## 2023-06-17 DIAGNOSIS — E539 Vitamin B deficiency, unspecified: Secondary | ICD-10-CM | POA: Diagnosis not present

## 2023-06-17 DIAGNOSIS — E782 Mixed hyperlipidemia: Secondary | ICD-10-CM | POA: Diagnosis not present

## 2023-06-22 DIAGNOSIS — E1140 Type 2 diabetes mellitus with diabetic neuropathy, unspecified: Secondary | ICD-10-CM | POA: Diagnosis not present

## 2023-06-22 DIAGNOSIS — E1151 Type 2 diabetes mellitus with diabetic peripheral angiopathy without gangrene: Secondary | ICD-10-CM | POA: Diagnosis not present

## 2023-07-18 DIAGNOSIS — I10 Essential (primary) hypertension: Secondary | ICD-10-CM | POA: Diagnosis not present

## 2023-07-18 DIAGNOSIS — E782 Mixed hyperlipidemia: Secondary | ICD-10-CM | POA: Diagnosis not present

## 2023-07-18 DIAGNOSIS — D649 Anemia, unspecified: Secondary | ICD-10-CM | POA: Diagnosis not present

## 2023-07-18 DIAGNOSIS — E539 Vitamin B deficiency, unspecified: Secondary | ICD-10-CM | POA: Diagnosis not present

## 2023-07-18 DIAGNOSIS — E1122 Type 2 diabetes mellitus with diabetic chronic kidney disease: Secondary | ICD-10-CM | POA: Diagnosis not present

## 2023-08-17 DIAGNOSIS — E1142 Type 2 diabetes mellitus with diabetic polyneuropathy: Secondary | ICD-10-CM | POA: Diagnosis not present

## 2023-08-17 DIAGNOSIS — D559 Anemia due to enzyme disorder, unspecified: Secondary | ICD-10-CM | POA: Diagnosis not present

## 2023-08-17 DIAGNOSIS — E559 Vitamin D deficiency, unspecified: Secondary | ICD-10-CM | POA: Diagnosis not present

## 2023-08-17 DIAGNOSIS — M1000 Idiopathic gout, unspecified site: Secondary | ICD-10-CM | POA: Diagnosis not present

## 2023-08-17 DIAGNOSIS — E1122 Type 2 diabetes mellitus with diabetic chronic kidney disease: Secondary | ICD-10-CM | POA: Diagnosis not present

## 2023-08-17 DIAGNOSIS — E7849 Other hyperlipidemia: Secondary | ICD-10-CM | POA: Diagnosis not present

## 2023-08-17 DIAGNOSIS — E1165 Type 2 diabetes mellitus with hyperglycemia: Secondary | ICD-10-CM | POA: Diagnosis not present

## 2023-08-17 DIAGNOSIS — I10 Essential (primary) hypertension: Secondary | ICD-10-CM | POA: Diagnosis not present

## 2023-08-17 DIAGNOSIS — N1831 Chronic kidney disease, stage 3a: Secondary | ICD-10-CM | POA: Diagnosis not present

## 2023-08-17 DIAGNOSIS — E039 Hypothyroidism, unspecified: Secondary | ICD-10-CM | POA: Diagnosis not present

## 2023-08-18 DIAGNOSIS — E1122 Type 2 diabetes mellitus with diabetic chronic kidney disease: Secondary | ICD-10-CM | POA: Diagnosis not present

## 2023-08-18 DIAGNOSIS — I10 Essential (primary) hypertension: Secondary | ICD-10-CM | POA: Diagnosis not present

## 2023-08-18 DIAGNOSIS — E539 Vitamin B deficiency, unspecified: Secondary | ICD-10-CM | POA: Diagnosis not present

## 2023-08-18 DIAGNOSIS — D649 Anemia, unspecified: Secondary | ICD-10-CM | POA: Diagnosis not present

## 2023-08-18 DIAGNOSIS — E782 Mixed hyperlipidemia: Secondary | ICD-10-CM | POA: Diagnosis not present

## 2023-08-24 DIAGNOSIS — Z1389 Encounter for screening for other disorder: Secondary | ICD-10-CM | POA: Diagnosis not present

## 2023-08-24 DIAGNOSIS — Z0001 Encounter for general adult medical examination with abnormal findings: Secondary | ICD-10-CM | POA: Diagnosis not present

## 2023-08-24 DIAGNOSIS — E1122 Type 2 diabetes mellitus with diabetic chronic kidney disease: Secondary | ICD-10-CM | POA: Diagnosis not present

## 2023-08-24 DIAGNOSIS — M1000 Idiopathic gout, unspecified site: Secondary | ICD-10-CM | POA: Diagnosis not present

## 2023-08-24 DIAGNOSIS — I10 Essential (primary) hypertension: Secondary | ICD-10-CM | POA: Diagnosis not present

## 2023-08-31 DIAGNOSIS — E1151 Type 2 diabetes mellitus with diabetic peripheral angiopathy without gangrene: Secondary | ICD-10-CM | POA: Diagnosis not present

## 2023-08-31 DIAGNOSIS — E1140 Type 2 diabetes mellitus with diabetic neuropathy, unspecified: Secondary | ICD-10-CM | POA: Diagnosis not present

## 2023-09-01 DIAGNOSIS — H35031 Hypertensive retinopathy, right eye: Secondary | ICD-10-CM | POA: Diagnosis not present

## 2023-09-12 DIAGNOSIS — H26493 Other secondary cataract, bilateral: Secondary | ICD-10-CM | POA: Diagnosis not present

## 2023-09-12 DIAGNOSIS — H26492 Other secondary cataract, left eye: Secondary | ICD-10-CM | POA: Diagnosis not present

## 2023-09-12 DIAGNOSIS — H26491 Other secondary cataract, right eye: Secondary | ICD-10-CM | POA: Diagnosis not present

## 2023-09-14 DIAGNOSIS — E11622 Type 2 diabetes mellitus with other skin ulcer: Secondary | ICD-10-CM | POA: Diagnosis not present

## 2023-09-14 DIAGNOSIS — L97929 Non-pressure chronic ulcer of unspecified part of left lower leg with unspecified severity: Secondary | ICD-10-CM | POA: Diagnosis not present

## 2023-09-14 DIAGNOSIS — Z7984 Long term (current) use of oral hypoglycemic drugs: Secondary | ICD-10-CM | POA: Diagnosis not present

## 2023-09-14 DIAGNOSIS — I10 Essential (primary) hypertension: Secondary | ICD-10-CM | POA: Diagnosis not present

## 2023-09-14 DIAGNOSIS — K219 Gastro-esophageal reflux disease without esophagitis: Secondary | ICD-10-CM | POA: Diagnosis not present

## 2023-09-14 DIAGNOSIS — M109 Gout, unspecified: Secondary | ICD-10-CM | POA: Diagnosis not present

## 2023-09-14 DIAGNOSIS — L97921 Non-pressure chronic ulcer of unspecified part of left lower leg limited to breakdown of skin: Secondary | ICD-10-CM | POA: Diagnosis not present

## 2023-09-14 DIAGNOSIS — I890 Lymphedema, not elsewhere classified: Secondary | ICD-10-CM | POA: Diagnosis not present

## 2023-09-14 DIAGNOSIS — Z87891 Personal history of nicotine dependence: Secondary | ICD-10-CM | POA: Diagnosis not present

## 2023-09-14 DIAGNOSIS — L97911 Non-pressure chronic ulcer of unspecified part of right lower leg limited to breakdown of skin: Secondary | ICD-10-CM | POA: Diagnosis not present

## 2023-09-14 DIAGNOSIS — E7800 Pure hypercholesterolemia, unspecified: Secondary | ICD-10-CM | POA: Diagnosis not present

## 2023-09-14 DIAGNOSIS — Z79899 Other long term (current) drug therapy: Secondary | ICD-10-CM | POA: Diagnosis not present

## 2023-09-16 DIAGNOSIS — E782 Mixed hyperlipidemia: Secondary | ICD-10-CM | POA: Diagnosis not present

## 2023-09-16 DIAGNOSIS — D649 Anemia, unspecified: Secondary | ICD-10-CM | POA: Diagnosis not present

## 2023-09-16 DIAGNOSIS — I10 Essential (primary) hypertension: Secondary | ICD-10-CM | POA: Diagnosis not present

## 2023-09-16 DIAGNOSIS — E1122 Type 2 diabetes mellitus with diabetic chronic kidney disease: Secondary | ICD-10-CM | POA: Diagnosis not present

## 2023-09-16 DIAGNOSIS — E539 Vitamin B deficiency, unspecified: Secondary | ICD-10-CM | POA: Diagnosis not present

## 2023-09-27 DIAGNOSIS — M79605 Pain in left leg: Secondary | ICD-10-CM | POA: Diagnosis not present

## 2023-09-27 DIAGNOSIS — R600 Localized edema: Secondary | ICD-10-CM | POA: Diagnosis not present

## 2023-09-27 DIAGNOSIS — L97911 Non-pressure chronic ulcer of unspecified part of right lower leg limited to breakdown of skin: Secondary | ICD-10-CM | POA: Diagnosis not present

## 2023-09-27 DIAGNOSIS — M79604 Pain in right leg: Secondary | ICD-10-CM | POA: Diagnosis not present

## 2023-09-27 DIAGNOSIS — L97921 Non-pressure chronic ulcer of unspecified part of left lower leg limited to breakdown of skin: Secondary | ICD-10-CM | POA: Diagnosis not present

## 2023-09-30 DIAGNOSIS — L97921 Non-pressure chronic ulcer of unspecified part of left lower leg limited to breakdown of skin: Secondary | ICD-10-CM | POA: Diagnosis not present

## 2023-09-30 DIAGNOSIS — L97911 Non-pressure chronic ulcer of unspecified part of right lower leg limited to breakdown of skin: Secondary | ICD-10-CM | POA: Diagnosis not present

## 2023-10-01 DIAGNOSIS — Z23 Encounter for immunization: Secondary | ICD-10-CM | POA: Diagnosis not present

## 2023-10-05 DIAGNOSIS — Z87891 Personal history of nicotine dependence: Secondary | ICD-10-CM | POA: Diagnosis not present

## 2023-10-05 DIAGNOSIS — Z79899 Other long term (current) drug therapy: Secondary | ICD-10-CM | POA: Diagnosis not present

## 2023-10-05 DIAGNOSIS — I10 Essential (primary) hypertension: Secondary | ICD-10-CM | POA: Diagnosis not present

## 2023-10-05 DIAGNOSIS — M109 Gout, unspecified: Secondary | ICD-10-CM | POA: Diagnosis not present

## 2023-10-05 DIAGNOSIS — E7800 Pure hypercholesterolemia, unspecified: Secondary | ICD-10-CM | POA: Diagnosis not present

## 2023-10-05 DIAGNOSIS — I890 Lymphedema, not elsewhere classified: Secondary | ICD-10-CM | POA: Diagnosis not present

## 2023-10-05 DIAGNOSIS — E11622 Type 2 diabetes mellitus with other skin ulcer: Secondary | ICD-10-CM | POA: Diagnosis not present

## 2023-10-05 DIAGNOSIS — K219 Gastro-esophageal reflux disease without esophagitis: Secondary | ICD-10-CM | POA: Diagnosis not present

## 2023-10-05 DIAGNOSIS — Z7984 Long term (current) use of oral hypoglycemic drugs: Secondary | ICD-10-CM | POA: Diagnosis not present

## 2023-10-05 DIAGNOSIS — L97911 Non-pressure chronic ulcer of unspecified part of right lower leg limited to breakdown of skin: Secondary | ICD-10-CM | POA: Diagnosis not present

## 2023-10-05 DIAGNOSIS — L97921 Non-pressure chronic ulcer of unspecified part of left lower leg limited to breakdown of skin: Secondary | ICD-10-CM | POA: Diagnosis not present

## 2023-10-10 DIAGNOSIS — I10 Essential (primary) hypertension: Secondary | ICD-10-CM | POA: Diagnosis not present

## 2023-10-10 DIAGNOSIS — E7800 Pure hypercholesterolemia, unspecified: Secondary | ICD-10-CM | POA: Diagnosis not present

## 2023-10-10 DIAGNOSIS — L97911 Non-pressure chronic ulcer of unspecified part of right lower leg limited to breakdown of skin: Secondary | ICD-10-CM | POA: Diagnosis not present

## 2023-10-10 DIAGNOSIS — I890 Lymphedema, not elsewhere classified: Secondary | ICD-10-CM | POA: Diagnosis not present

## 2023-10-10 DIAGNOSIS — Z7984 Long term (current) use of oral hypoglycemic drugs: Secondary | ICD-10-CM | POA: Diagnosis not present

## 2023-10-10 DIAGNOSIS — K219 Gastro-esophageal reflux disease without esophagitis: Secondary | ICD-10-CM | POA: Diagnosis not present

## 2023-10-10 DIAGNOSIS — Z87891 Personal history of nicotine dependence: Secondary | ICD-10-CM | POA: Diagnosis not present

## 2023-10-10 DIAGNOSIS — E11622 Type 2 diabetes mellitus with other skin ulcer: Secondary | ICD-10-CM | POA: Diagnosis not present

## 2023-10-10 DIAGNOSIS — L97921 Non-pressure chronic ulcer of unspecified part of left lower leg limited to breakdown of skin: Secondary | ICD-10-CM | POA: Diagnosis not present

## 2023-10-10 DIAGNOSIS — M109 Gout, unspecified: Secondary | ICD-10-CM | POA: Diagnosis not present

## 2023-10-10 DIAGNOSIS — Z79899 Other long term (current) drug therapy: Secondary | ICD-10-CM | POA: Diagnosis not present

## 2023-10-12 DIAGNOSIS — M25532 Pain in left wrist: Secondary | ICD-10-CM | POA: Diagnosis not present

## 2023-10-12 DIAGNOSIS — M109 Gout, unspecified: Secondary | ICD-10-CM | POA: Diagnosis not present

## 2023-10-18 DIAGNOSIS — I10 Essential (primary) hypertension: Secondary | ICD-10-CM | POA: Diagnosis not present

## 2023-10-18 DIAGNOSIS — E1122 Type 2 diabetes mellitus with diabetic chronic kidney disease: Secondary | ICD-10-CM | POA: Diagnosis not present

## 2023-10-18 DIAGNOSIS — E539 Vitamin B deficiency, unspecified: Secondary | ICD-10-CM | POA: Diagnosis not present

## 2023-10-18 DIAGNOSIS — D649 Anemia, unspecified: Secondary | ICD-10-CM | POA: Diagnosis not present

## 2023-10-18 DIAGNOSIS — E782 Mixed hyperlipidemia: Secondary | ICD-10-CM | POA: Diagnosis not present

## 2023-10-20 DIAGNOSIS — I10 Essential (primary) hypertension: Secondary | ICD-10-CM | POA: Diagnosis not present

## 2023-10-20 DIAGNOSIS — I890 Lymphedema, not elsewhere classified: Secondary | ICD-10-CM | POA: Diagnosis not present

## 2023-10-20 DIAGNOSIS — E7800 Pure hypercholesterolemia, unspecified: Secondary | ICD-10-CM | POA: Diagnosis not present

## 2023-10-20 DIAGNOSIS — Z79899 Other long term (current) drug therapy: Secondary | ICD-10-CM | POA: Diagnosis not present

## 2023-10-20 DIAGNOSIS — L97911 Non-pressure chronic ulcer of unspecified part of right lower leg limited to breakdown of skin: Secondary | ICD-10-CM | POA: Diagnosis not present

## 2023-10-20 DIAGNOSIS — L97921 Non-pressure chronic ulcer of unspecified part of left lower leg limited to breakdown of skin: Secondary | ICD-10-CM | POA: Diagnosis not present

## 2023-10-20 DIAGNOSIS — Z87891 Personal history of nicotine dependence: Secondary | ICD-10-CM | POA: Diagnosis not present

## 2023-10-26 DIAGNOSIS — L97921 Non-pressure chronic ulcer of unspecified part of left lower leg limited to breakdown of skin: Secondary | ICD-10-CM | POA: Diagnosis not present

## 2023-10-26 DIAGNOSIS — E7800 Pure hypercholesterolemia, unspecified: Secondary | ICD-10-CM | POA: Diagnosis not present

## 2023-10-26 DIAGNOSIS — L97911 Non-pressure chronic ulcer of unspecified part of right lower leg limited to breakdown of skin: Secondary | ICD-10-CM | POA: Diagnosis not present

## 2023-10-26 DIAGNOSIS — Z87891 Personal history of nicotine dependence: Secondary | ICD-10-CM | POA: Diagnosis not present

## 2023-10-26 DIAGNOSIS — Z79899 Other long term (current) drug therapy: Secondary | ICD-10-CM | POA: Diagnosis not present

## 2023-10-26 DIAGNOSIS — I10 Essential (primary) hypertension: Secondary | ICD-10-CM | POA: Diagnosis not present

## 2023-10-26 DIAGNOSIS — I890 Lymphedema, not elsewhere classified: Secondary | ICD-10-CM | POA: Diagnosis not present

## 2023-11-03 DIAGNOSIS — L97911 Non-pressure chronic ulcer of unspecified part of right lower leg limited to breakdown of skin: Secondary | ICD-10-CM | POA: Diagnosis not present

## 2023-11-03 DIAGNOSIS — E7800 Pure hypercholesterolemia, unspecified: Secondary | ICD-10-CM | POA: Diagnosis not present

## 2023-11-03 DIAGNOSIS — L97921 Non-pressure chronic ulcer of unspecified part of left lower leg limited to breakdown of skin: Secondary | ICD-10-CM | POA: Diagnosis not present

## 2023-11-03 DIAGNOSIS — Z87891 Personal history of nicotine dependence: Secondary | ICD-10-CM | POA: Diagnosis not present

## 2023-11-03 DIAGNOSIS — I890 Lymphedema, not elsewhere classified: Secondary | ICD-10-CM | POA: Diagnosis not present

## 2023-11-03 DIAGNOSIS — Z79899 Other long term (current) drug therapy: Secondary | ICD-10-CM | POA: Diagnosis not present

## 2023-11-03 DIAGNOSIS — I10 Essential (primary) hypertension: Secondary | ICD-10-CM | POA: Diagnosis not present

## 2023-11-07 DIAGNOSIS — H43821 Vitreomacular adhesion, right eye: Secondary | ICD-10-CM | POA: Diagnosis not present

## 2023-11-08 DIAGNOSIS — E1151 Type 2 diabetes mellitus with diabetic peripheral angiopathy without gangrene: Secondary | ICD-10-CM | POA: Diagnosis not present

## 2023-11-08 DIAGNOSIS — E1140 Type 2 diabetes mellitus with diabetic neuropathy, unspecified: Secondary | ICD-10-CM | POA: Diagnosis not present

## 2023-11-09 DIAGNOSIS — Z79899 Other long term (current) drug therapy: Secondary | ICD-10-CM | POA: Diagnosis not present

## 2023-11-09 DIAGNOSIS — I10 Essential (primary) hypertension: Secondary | ICD-10-CM | POA: Diagnosis not present

## 2023-11-09 DIAGNOSIS — I890 Lymphedema, not elsewhere classified: Secondary | ICD-10-CM | POA: Diagnosis not present

## 2023-11-09 DIAGNOSIS — Z87891 Personal history of nicotine dependence: Secondary | ICD-10-CM | POA: Diagnosis not present

## 2023-11-09 DIAGNOSIS — L97911 Non-pressure chronic ulcer of unspecified part of right lower leg limited to breakdown of skin: Secondary | ICD-10-CM | POA: Diagnosis not present

## 2023-11-09 DIAGNOSIS — L97921 Non-pressure chronic ulcer of unspecified part of left lower leg limited to breakdown of skin: Secondary | ICD-10-CM | POA: Diagnosis not present

## 2023-11-09 DIAGNOSIS — E7800 Pure hypercholesterolemia, unspecified: Secondary | ICD-10-CM | POA: Diagnosis not present

## 2024-01-04 NOTE — Progress Notes (Signed)
 [name] 73 y.o. March 05, 1950 Phone: [phone] (home)  Address: [address] [place] [address]  MRN: [id number] Primary MD : Jerel Kimberlee Toribio [facility]   Problem List Items Addressed This Visit       Other   Leg ulcer, left, limited to breakdown of skin    (CMS-HCC) - Primary   Relevant Medications   silver nitrate applicator 1 Stick (Completed)   A TheraSkin would cost $300 per application.  This is cost prohibitive.   Her wounds are improving.  She had maceration with Mepilex.  She has improved drainage with silver nitrate to hypergranulation tissue and the unna boot.  Change twice a week.  Circaid for the right leg.  I will see her back in three weeks.  She will continue with her nurse visits.  Preoperative diagnosis: Multiple open wounds of right lower leg, hypergranulation tissue  Postoperative diagnosis: Same  Procedure performed: Excisional debridement of biofilm from 20.14 (was 25) squared centimeters, application of silver nitrate   Anesthesia: None  Surgeon: Duwaine Jumper, MD  Procedure performed in detail:  After informed consent was obtained, a 7 curette and 3 curette were used to excise the biofilm from 20.14 (was 25) squared centimeters.  Silver nitrate was applied to all three wounds for hypergranulation tissue.  Patient tolerated procedure well.  I personally spent over 20 minutes face-to-face and non-face-to-face in the care of this patient, which includes all pre, intra, and post visit time on the date of service.  All documented time was specific to the E/M visit and does not include any procedures that may have been performed.  Wound Care Visit  Past Medical History[1] Past Surgical History[2] Allergies[3]  Meds: Current Medications[4]  SocHx:  reports that she has quit smoking. Her smoking use included cigarettes. She has never used smokeless tobacco. She reports that she does not drink alcohol and does  not use drugs.  FamHx: has no family status information on file.    Review of Systems A 12 system review of systems was negative except as noted in HPI  No special needs identified  Subjective:     Chelsea Bates presents to the wound care clinic for evaluation of bilateral lower extremity wounds left worse than right.  She reports a long standing history of lymphedema and that these wounds have been present since at least January 2025. She reports that several years ago she had home health but got a [employer] two years ago and home health was discontinued.  She has not had recent ABIs/venous duplex.  She is a former smoker.  01/04/24  She returns for follow up.  She has no complaints.  She got up early to make a butter pecan bunt cake.  12/21/23  She returns for follow up.  She denies any issues.  12/07/23  Nursing reports that her leg was very macerated so they just put on the unna without the Mepilex last week.  The patient has no complaints.  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  11/23/23  She presents for follow up. She wants to know how they are doing.  Nursing reports they had to increase to twice a week changes for drainage.  11/09/23  She presents for follow up.  She reports her right leg continues to be healed and her left leg, where she can see it, is doing better.  10/26/23  She presents for  follow up.  She is doing unna boots at the wound center.  She has no complaints.  She thinks her wounds are doing much better. The right leg has healed.  10/05/23  She presents for follow up.   She had her ABIs and venous duplex.  ABIs are normal. TBIs are slightly abnormal but there are no foot wounds.  She is doing well with the Mepilex.  She was sent a generic off brand Mepilex equivalent.  She does not think this is working as well as the Mepilex.  Impression  No evidence of DVT in either lower extremity.      Signed (Electronic Signature): 09/27/2023 9:55 AM  Signed By: Emelia Bame, MD   Narrative  Exam:  Bilateral Lower Extremity DVT Ultrasound Exam     History:  Bilateral leg pain and swelling.     Technique:  Real-time grayscale compression ultrasound of both lower extremities was performed from the common femoral vein through the popliteal vein to include the proximal end of the greater saphenous vein. The calf veins were also interrogated to the extent that they were visible. This examination was supplemented with color flow and Doppler interrogation.     Comparison:  06/02/2021     Findings:  The deep venous system of both lower extremities is widely patent and compressible. There is no evidence of deep venous thrombosis. There is normal phasicity of flow with respiration of each the lower extremity. There is appropriate ipsilateral flow augmentation with calf compression.     Bilateral calf subcutaneous edema. No popliteal fossa cyst or mass.   Narrative  Patient Info  Name:     Chelsea Bates  Age:     73 years  DOB:     [birth year]  Gender:     Female  MRN:     [id number]  Accession #:     [id number]  Exam Date:     09/30/2023 9:00 AM  Patient Status:     O  Exam Type:     PVL ABI  Technical Quality:     Diagnostic quality    Staff  Sonographer:     Mliss Gate  Ordering Physician:     Duwaine Rollo Jumper  Attending Physician:     Duwaine Rollo Jumper  Referring Physician:     Duwaine Rollo Jumper    Study Info  Indications       - right leg ulcer,  nonhealing      L97.911 - Bilateral leg ulcer,  limited to breakdown of skin  (CMS-HCC)      L97.921 - Bilateral leg ulcer,  limited to breakdown of skin  (CMS-HCC)  Procedure(s)    * An ankle to brachial index, toe to brachial index, ankle PVR waveforms and  photoplethysmographic (PPG) toe waveforms were obtained using the [employer] device.      Final Impression    * The bilateral ABIs are  normal.    * The right TBI is moderately abnormal.    * The left TBI is  mildly abnormal.      ABI Measurements  RIGHT Brachial Pressure (mmHg): 164  RIGHT DP Pressure (mmHg): 168  RIGHT PTA Pressure (mmHg): 154  LEFT Brachial Pressure (mmHg): 168  LEFT DP Pressure (mmHg): 163  LEFT PTA Pressure (mmHg): 158    RIGHT ABI Ratio: 1.00  LEFT ABI Ratio: 0.97    TBI Measurements  RIGHT Great Toe Pressure (mmHg): 84  LEFT Great Toe Pressure (  mmHg): 117    RIGHT TBI Ratio: 0.50  LEFT TBI Ratio: 0.70      Report Signatures  Finalized by Lonni Kennie Sor  MD on 10/02/2023 03:19 PM   I personally reviewed the images and the reports.  09/14/23  Initial visit  Objective:   BP 140/82 (BP Position: Sitting)   Pulse 62   Temp 36.2 C (97.1 F) (Temporal)   Resp 16   SpO2 96%  General:  well appearing  Pulmonary: CTAB, no wheezes, rhonci, crackles  CV:   RRR, S1,S2, no murmurs, gallops,rubs  GI: soft, bowel sounds active, non-tender  Neuro:     AAO x 3  Psych : Normal mood and judgement  Pulse :    Not readily appreciated   Wound:   Left anterior : 2 x 0.9 cm  Proximal left lateral: 4 x 3.5 cm  Distal left lateral: 3.1 x 1.4 cm  Right leg: Healed             [1] Past Medical History: Diagnosis Date   Diabetes    (CMS-HCC)    GERD (gastroesophageal reflux disease)    Gout    HTN (hypertension)    Hypercholesterolemia    Irregular heart beat    Seasonal allergies   [2] Past Surgical History: Procedure Laterality Date   CATARACT EXTRACTION, BILATERAL     CHOLECYSTECTOMY     KNEE ARTHROSCOPY Right    VEIN SURGERY  09/13/2021  [3] Allergies Allergen Reactions   Peanut Anaphylaxis   Ace Inhibitors   [4]  Current Outpatient Medications:    allopurinol (ZYLOPRIM) 300 MG tablet, Take 1 tablet (300 mg total) by mouth daily., Disp: , Rfl:    atorvastatin (LIPITOR) 10 MG tablet, Take 1 tablet (10 mg total) by mouth every other day.,  Disp: , Rfl:    colchicine (COLCRYS) 0.6 mg tablet, Take 1 tablet (0.6 mg total) by mouth daily., Disp: , Rfl:    cyanocobalamin  500 MCG tablet, Take 1 tablet (500 mcg total) by mouth daily., Disp: , Rfl:    dilTIAZem (CARDIZEM CD) 240 MG 24 hr capsule, Take 1 capsule (240 mg total) by mouth daily., Disp: , Rfl:    furosemide (LASIX) 40 MG tablet, Take 1 tablet (40 mg total) by mouth two (2) times a day., Disp: , Rfl:    glipiZIDE (GLUCOTROL XL) 10 MG 24 hr tablet, Take 1 tablet (10 mg total) by mouth daily., Disp: , Rfl:    loratadine (CLARITIN REDITABS) 10 mg dissolvable tablet, Dissolve 1 tablet (10 mg total) in the mouth daily., Disp: , Rfl:    losartan (COZAAR) 100 MG tablet, Take 1 tablet (100 mg total) by mouth daily., Disp: , Rfl:    metFORMIN (GLUCOPHAGE) 500 MG tablet, Take 4 tablets (2,000 mg total) by mouth in the morning., Disp: , Rfl:    metoPROLOL tartrate (LOPRESSOR) 50 MG tablet, Take 1 tablet (50 mg total) by mouth Three (3) times a day before meals., Disp: , Rfl:    omeprazole (PRILOSEC OTC) 20 MG tablet, Take 1 tablet (20 mg total) by mouth two (2) times a day., Disp: , Rfl:    semaglutide (RYBELSUS) 14 mg Tab, Take 14 mg by mouth in the morning., Disp: , Rfl:  No current facility-administered medications for this visit.

## 2024-02-17 ENCOUNTER — Encounter: Payer: Self-pay | Admitting: *Deleted

## 2024-02-17 NOTE — Progress Notes (Signed)
 Chelsea Bates                                          MRN: [id number]   02/17/2024   The [facility] Quality Team Specialist reviewed this patient medical record for the purposes of chart review for care gap closure. The following were reviewed: chart review for care gap closure-glycemic status assessment.    [facility] Quality Team
